# Patient Record
Sex: Female | Born: 1990 | Race: White | Hispanic: No | Marital: Married | Smoking: Never smoker
Health system: Southern US, Community
[De-identification: ages and names within clinical notes are randomized; demographics above are authoritative.]

## PROBLEM LIST (undated history)

## (undated) ENCOUNTER — Inpatient Hospital Stay (HOSPITAL_COMMUNITY): Payer: Self-pay

## (undated) DIAGNOSIS — O0993 Supervision of high risk pregnancy, unspecified, third trimester: Principal | ICD-10-CM

## (undated) DIAGNOSIS — J45909 Unspecified asthma, uncomplicated: Secondary | ICD-10-CM

## (undated) DIAGNOSIS — O24419 Gestational diabetes mellitus in pregnancy, unspecified control: Secondary | ICD-10-CM

## (undated) HISTORY — PX: APPENDECTOMY: SHX54

## (undated) HISTORY — DX: Supervision of high risk pregnancy, unspecified, third trimester: O09.93

## (undated) HISTORY — PX: DENTAL SURGERY: SHX609

---

## 2017-05-14 LAB — OB RESULTS CONSOLE RPR: RPR: NONREACTIVE

## 2017-05-14 LAB — OB RESULTS CONSOLE ABO/RH: RH Type: POSITIVE

## 2017-05-14 LAB — OB RESULTS CONSOLE GC/CHLAMYDIA
CHLAMYDIA, DNA PROBE: NEGATIVE
GC PROBE AMP, GENITAL: NEGATIVE

## 2017-05-14 LAB — OB RESULTS CONSOLE RUBELLA ANTIBODY, IGM: RUBELLA: IMMUNE

## 2017-05-14 LAB — OB RESULTS CONSOLE HIV ANTIBODY (ROUTINE TESTING): HIV: NONREACTIVE

## 2017-05-14 LAB — OB RESULTS CONSOLE HEPATITIS B SURFACE ANTIGEN: Hepatitis B Surface Ag: NEGATIVE

## 2017-05-14 LAB — CYTOLOGY - PAP: Pap: NEGATIVE

## 2017-06-30 NOTE — L&D Delivery Note (Signed)
Delivery Note At 12:57 PM a viable female was delivered via Vaginal, Spontaneous (Presentation: LOT, restitution to LOA).  APGAR: 9, 9; weight 3590g. Placenta status:intact, sent to L&D.  Cord: three vessels  with the following complications0: none .  Cord pH: N/A  Anesthesia:  Epidural Episiotomy: None Lacerations: 1st degree, hemostatic Suture Repair: N/A Est. Blood Loss (mL): 150  [facility] to [facility].  Baby to Couplet care / Skin to Skin.  Calvert CantorSamantha C Moani Weipert, CNM 12/11/2017, 1:14 PM

## 2017-08-06 ENCOUNTER — Inpatient Hospital Stay (HOSPITAL_COMMUNITY)
Admission: AD | Admit: 2017-08-06 | Discharge: 2017-08-07 | Disposition: A | Discharge status: Home / Self Care | Payer: Medicaid Other | Source: Ambulatory Visit | Attending: Obstetrics & Gynecology | Admitting: Obstetrics & Gynecology

## 2017-08-06 ENCOUNTER — Encounter (HOSPITAL_COMMUNITY): Payer: Self-pay | Admitting: *Deleted

## 2017-08-06 DIAGNOSIS — O4692 Antepartum hemorrhage, unspecified, second trimester: Secondary | ICD-10-CM | POA: Insufficient documentation

## 2017-08-06 DIAGNOSIS — Z3A21 21 weeks gestation of pregnancy: Secondary | ICD-10-CM | POA: Insufficient documentation

## 2017-08-06 DIAGNOSIS — Z833 Family history of diabetes mellitus: Secondary | ICD-10-CM | POA: Insufficient documentation

## 2017-08-06 DIAGNOSIS — O4702 False labor before 37 completed weeks of gestation, second trimester: Secondary | ICD-10-CM | POA: Insufficient documentation

## 2017-08-06 DIAGNOSIS — O479 False labor, unspecified: Secondary | ICD-10-CM

## 2017-08-06 DIAGNOSIS — O4690 Antepartum hemorrhage, unspecified, unspecified trimester: Secondary | ICD-10-CM

## 2017-08-06 DIAGNOSIS — N939 Abnormal uterine and vaginal bleeding, unspecified: Secondary | ICD-10-CM | POA: Diagnosis present

## 2017-08-06 HISTORY — DX: Unspecified asthma, uncomplicated: J45.909

## 2017-08-06 LAB — URINALYSIS, ROUTINE W REFLEX MICROSCOPIC
Bilirubin Urine: NEGATIVE
Glucose, UA: NEGATIVE mg/dL
Hgb urine dipstick: NEGATIVE
Ketones, ur: NEGATIVE mg/dL
NITRITE: NEGATIVE
PROTEIN: NEGATIVE mg/dL
SPECIFIC GRAVITY, URINE: 1.028 (ref 1.005–1.030)
pH: 5 (ref 5.0–8.0)

## 2017-08-06 NOTE — Discharge Instructions (Signed)
Safe Medications in Pregnancy   Acne: Benzoyl Peroxide Salicylic Acid  Backache/Headache: Tylenol: 2 regular strength every 4 hours OR              2 Extra strength every 6 hours  Colds/Coughs/Allergies: Benadryl (alcohol free) 25 mg every 6 hours as needed Breath right strips Claritin Cepacol throat lozenges Chloraseptic throat spray Cold-Eeze- up to three times per day Cough drops, alcohol free Flonase (by prescription only) Guaifenesin Mucinex Robitussin DM (plain only, alcohol free) Saline nasal spray/drops Sudafed (pseudoephedrine) & Actifed ** use only after [redacted] weeks gestation and if you do not have high blood pressure Tylenol Vicks Vaporub Zinc lozenges Zyrtec   Constipation: Colace Ducolax suppositories Fleet enema Glycerin suppositories Metamucil Milk of magnesia Miralax Senokot Smooth move tea  Diarrhea: Kaopectate Imodium A-D  *NO pepto Bismol  Hemorrhoids: Anusol Anusol HC Preparation H Tucks  Indigestion: Tums Maalox Mylanta Zantac  Pepcid  Insomnia: Benadryl (alcohol free) 25mg  every 6 hours as needed Tylenol PM Unisom, no Gelcaps  Leg Cramps: Tums MagGel  Nausea/Vomiting:  Bonine Dramamine Emetrol Ginger extract Sea bands Meclizine  Nausea medication to take during pregnancy:  Unisom (doxylamine succinate 25 mg tablets) Take one tablet daily at bedtime. If symptoms are not adequately controlled, the dose can be increased to a maximum recommended dose of two tablets daily (1/2 tablet in the morning, 1/2 tablet mid-afternoon and one at bedtime). Vitamin B6 100mg  tablets. Take one tablet twice a day (up to 200 mg per day).  Skin Rashes: Aveeno products Benadryl cream or 25mg  every 6 hours as needed Calamine Lotion 1% cortisone cream  Yeast infection: Gyne-lotrimin 7 Monistat 7   **If taking multiple medications, please check labels to avoid duplicating the same active ingredients **take medication as directed on  the label ** Do not exceed 4000 mg of tylenol in 24 hours **Do not take medications that contain aspirin or ibuprofen    [place] [facility] Providers    [facility]       Phone: [phone]  [facility] [place] Phone: [phone]  [facility] [place]  Phone: [phone]  [facility] [place]  Phone: [phone]  [facility] [place]  Phone: [phone]  [facility]       Phone: [phone]  [facility]    Phone: [phone]   [facility] [place]    Phone: [phone]  [facility]    Phone: [phone]  [facility]    Phone: [phone]  [facility]    Phone: [phone]  [facility]       Phone: [phone]   [facility]  Phone: [phone]  [facility]    Phone: [phone]  [facility] [place]   Phone: [phone]  [facility]      Phone: [phone]  [facility]    Phone: [phone]   AREA PEDIATRIC/FAMILY PRACTICE PHYSICIANS  [facility] [address] [place], [place]  [address] Phone - [phone]   Fax - [phone]  [facility] [address] [place], [place] [address] Phone - [phone]   Fax - [phone]  JACK AMOS [address] [place], [place]  [address] Phone - [phone]   Fax - [phone]  [facility] [address] [place], [place]  [address] Phone - [phone]   Fax - [phone]  [facility] [address] [place], [place]  [address] Phone - [phone]   Fax - [phone]  [facility] [address] [place], [place]  [address] Phone - [phone]   Fax - [phone]  [facility] [place] [address] [place], [place]  [address] Phone - [phone]   Fax - [phone]  [facility] [place] [address] [place], [address] [place], [place]  [address] Phone - [phone]   Fax - [phone]  [facility] [address] [place], [place]  [address] Phone - [phone]   Fax - [phone] [facility] [place] [address] [place], [place]  [address] Phone - [phone]   Fax - [phone]  [facility] [address] [place], [place]  [address] Phone - [phone]   Fax - [phone]  [facility] [address] [place], [place]  [address] Phone - [phone]   Fax - [phone]  [facility] [place] [address] [place], [place]  [address] Phone - [phone]   Fax - [phone]  [facility] [address], [address] [place], [place]  [address] Phone - [phone]  [facility] [address] [place], [place]  [address] Phone - [phone]   Fax - [phone]  [facility] [address] [place], [place]  [address] Phone - [phone]   Fax - [phone]  [facility] [address] [place], [place]  [address] Phone - [phone]   Fax - [phone]  [facility] [address] [place], [place]  [address] Phone - [phone]   Fax - [phone]   [facility] [address] [place], [place]  [address] Phone - [phone]   Fax - [phone]  [facility] [address] [place], [place]  [address] Phone - [phone]   Fax - [phone]  DAVID RUBIN [address] [place], [place]  [address] Phone - [phone]   Fax -  [phone]  [facility] [address] [place], [place]  [address] Phone - [phone]   Fax - [phone]  [facility] Alita Chyle [address] [place], [place]  [address] Phone - [phone]   Fax - [phone] Gerarda Fraction [address] [place], [place]  [address] Phone - [phone]   Fax - [phone]  [facility] [address] [place], [place]  [address] Phone - [phone]   Fax - [phone]  [facility] - [place] [address] [place], [place]  [address] Phone - [phone]   Fax - [phone]  [facility]  Wyvonne Lenz MD [address] [place] [address] Phone [phone]  Fax [phone]

## 2017-08-06 NOTE — MAU Note (Addendum)
Pt presents to MAU c/o abdominal ctxs (x3) that started at 1649, 1705, and 1930. Pt states she has had braxton hicks recently but these were stronger. Pt had vaginal bleeding yesterday when she wiped and it was a bright red bleeding. The patient stated she wore a pad after the blood she noticed when she wiped and she never saw any more bleeding.Pt states early on in the pregnancy she had bleeding behind her placenta at 7weeks.  LMP: SEPT 14th 2018

## 2017-08-07 ENCOUNTER — Inpatient Hospital Stay (HOSPITAL_COMMUNITY): Payer: Medicaid Other

## 2017-08-07 DIAGNOSIS — O4690 Antepartum hemorrhage, unspecified, unspecified trimester: Secondary | ICD-10-CM

## 2017-08-07 LAB — WET PREP, GENITAL
SPERM: NONE SEEN
Trich, Wet Prep: NONE SEEN
YEAST WET PREP: NONE SEEN

## 2017-08-07 NOTE — MAU Provider Note (Signed)
History     CSN: [id number]  Arrival date and time: 08/06/17 2312   First Provider Initiated Contact with Patient 08/06/17 2356      Chief Complaint  Patient presents with  . Vaginal Bleeding  . Contractions   HPI Cathy Park is a 27 y.o. G3P2002 at [redacted]w[redacted]d who presents with abdominal pain and vaginal bleeding. She states yesterday she saw bright red bleeding on the tissue when she wipes. She has seen no bleeding since. She also reports feeling 3 contractions throughout the day today. She denies any leaking of fluid. Reports good fetal movement.   OB History    Gravida Para Term Preterm AB Living   3 2 2     2    SAB TAB Ectopic Multiple Live Births           2      Past Medical History:  Diagnosis Date  . Asthma     Past Surgical History:  Procedure Laterality Date  . APPENDECTOMY    . DENTAL SURGERY     drained infection    Family History  Problem Relation Age of Onset  . Diabetes Mother   . Diabetes Brother   . Cancer Paternal Aunt   . Diabetes Maternal Grandmother   . Diabetes Maternal Grandfather   . Cancer Paternal Grandmother   . Cancer Paternal Grandfather     Social History   Tobacco Use  . Smoking status: Never Smoker  . Smokeless tobacco: Never Used  Substance Use Topics  . Alcohol use: No    Frequency: Never  . Drug use: No    Allergies: No Known Allergies  No medications prior to admission.    Review of Systems  Constitutional: Negative.  Negative for fatigue and fever.  HENT: Negative.   Respiratory: Negative.  Negative for shortness of breath.   Cardiovascular: Negative.  Negative for chest pain.  Gastrointestinal: Positive for abdominal pain. Negative for constipation, diarrhea, nausea and vomiting.  Genitourinary: Positive for vaginal bleeding. Negative for dysuria.  Neurological: Negative.  Negative for dizziness and headaches.   Physical Exam   Blood pressure (!) 100/54, pulse 100, temperature 98.8 F (37.1 C), temperature  source Oral, resp. rate 19, height 5\' 2"  (1.575 m), weight 229 lb (103.9 kg), last menstrual period 03/13/2017.  Physical Exam  Nursing note and vitals reviewed. Constitutional: She is oriented to person, place, and time. She appears well-developed and well-nourished. No distress.  HENT:  Head: Normocephalic.  Eyes: Pupils are equal, round, and reactive to light.  Cardiovascular: Normal rate, regular rhythm and normal heart sounds.  Respiratory: Effort normal and breath sounds normal. No respiratory distress.  GI: Soft. Bowel sounds are normal. She exhibits no distension. There is no tenderness.  Neurological: She is alert and oriented to person, place, and time.  Skin: Skin is warm and dry.  Psychiatric: She has a normal mood and affect. Her behavior is normal. Judgment and thought content normal.   Pelvic exam: Cervix pink, visually closed, without lesion, scant white creamy discharge, vaginal walls and external genitalia normal  FHT: 153 bpm  Dilation: Closed Effacement (%): Thick Cervical Position: Posterior Exam by:: Antony Odea CNM    MAU Course  Procedures Results for orders placed or performed during the hospital encounter of 08/06/17 (from the past 24 hour(s))  Urinalysis, Routine w reflex microscopic     Status: Abnormal   Collection Time: 08/06/17 11:35 PM  Result Value Ref Range   Color, Urine YELLOW YELLOW  APPearance CLOUDY (A) CLEAR   Specific Gravity, Urine 1.028 1.005 - 1.030   pH 5.0 5.0 - 8.0   Glucose, UA NEGATIVE NEGATIVE mg/dL   Hgb urine dipstick NEGATIVE NEGATIVE   Bilirubin Urine NEGATIVE NEGATIVE   Ketones, ur NEGATIVE NEGATIVE mg/dL   Protein, ur NEGATIVE NEGATIVE mg/dL   Nitrite NEGATIVE NEGATIVE   Leukocytes, UA TRACE (A) NEGATIVE   RBC / HPF 0-5 0 - 5 RBC/hpf   WBC, UA 0-5 0 - 5 WBC/hpf   Bacteria, UA RARE (A) NONE SEEN   Squamous Epithelial / LPF 6-30 (A) NONE SEEN   Mucus PRESENT   Wet prep, genital     Status: Abnormal   Collection  Time: 08/07/17 12:00 AM  Result Value Ref Range   Yeast Wet Prep HPF POC NONE SEEN NONE SEEN   Trich, Wet Prep NONE SEEN NONE SEEN   Clue Cells Wet Prep HPF POC PRESENT (A) NONE SEEN   WBC, Wet Prep HPF POC FEW (A) NONE SEEN   Sperm NONE SEEN    MDM UA US MFM OB Limited- placenta posterior above the os, normal AFI No vaginal bleeding on exam  Wet prep and gc/chlamydia  Assessment and Plan   1. Vaginal bleeding in pregnancy   2. Braxton Hick's contraction   3. [redacted] weeks gestation of pregnancy    -Discharge home in stable condition -Vaginal bleeding precautions discussed -Patient advised to follow-up with OB/GYN provider of choice ASAP to resume prenatal care -Patient may return to MAU as needed or if her condition were to change or worsen  Rolm BookbinderCaroline M Averie Hornbaker CNM 08/07/2017, 1:00 AM

## 2017-08-10 LAB — GC/CHLAMYDIA PROBE AMP (~~LOC~~) NOT AT ARMC
Chlamydia: NEGATIVE
Neisseria Gonorrhea: NEGATIVE

## 2017-08-14 ENCOUNTER — Encounter (HOSPITAL_COMMUNITY): Payer: Self-pay | Admitting: Emergency Medicine

## 2017-08-14 ENCOUNTER — Other Ambulatory Visit: Payer: Self-pay

## 2017-08-14 DIAGNOSIS — R197 Diarrhea, unspecified: Secondary | ICD-10-CM | POA: Diagnosis not present

## 2017-08-14 DIAGNOSIS — R6883 Chills (without fever): Secondary | ICD-10-CM | POA: Diagnosis not present

## 2017-08-14 DIAGNOSIS — Z79899 Other long term (current) drug therapy: Secondary | ICD-10-CM | POA: Diagnosis not present

## 2017-08-14 DIAGNOSIS — J45909 Unspecified asthma, uncomplicated: Secondary | ICD-10-CM | POA: Diagnosis not present

## 2017-08-14 DIAGNOSIS — Z3A22 22 weeks gestation of pregnancy: Secondary | ICD-10-CM | POA: Insufficient documentation

## 2017-08-14 DIAGNOSIS — O219 Vomiting of pregnancy, unspecified: Secondary | ICD-10-CM | POA: Diagnosis present

## 2017-08-14 LAB — CBC
HEMATOCRIT: 37.3 % (ref 36.0–46.0)
Hemoglobin: 11.7 g/dL — ABNORMAL LOW (ref 12.0–15.0)
MCH: 24.2 pg — AB (ref 26.0–34.0)
MCHC: 31.4 g/dL (ref 30.0–36.0)
MCV: 77.2 fL — ABNORMAL LOW (ref 78.0–100.0)
Platelets: 226 10*3/uL (ref 150–400)
RBC: 4.83 MIL/uL (ref 3.87–5.11)
RDW: 15.6 % — ABNORMAL HIGH (ref 11.5–15.5)
WBC: 10.4 10*3/uL (ref 4.0–10.5)

## 2017-08-14 LAB — BASIC METABOLIC PANEL
Anion gap: 13 (ref 5–15)
BUN: 8 mg/dL (ref 6–20)
CHLORIDE: 102 mmol/L (ref 101–111)
CO2: 22 mmol/L (ref 22–32)
Calcium: 8.6 mg/dL — ABNORMAL LOW (ref 8.9–10.3)
Creatinine, Ser: 0.6 mg/dL (ref 0.44–1.00)
GFR calc non Af Amer: >60 mL/min (ref >60)
Glucose, Bld: 116 mg/dL — ABNORMAL HIGH (ref 65–99)
Potassium: 3.5 mmol/L (ref 3.5–5.1)
SODIUM: 137 mmol/L (ref 135–145)

## 2017-08-14 NOTE — ED Triage Notes (Signed)
Pt C/O vomiting that started this morning around 0900. Pt states she feels like she is dehydrated and cannot keep anything down.

## 2017-08-15 ENCOUNTER — Emergency Department (HOSPITAL_COMMUNITY)
Admission: EM | Admit: 2017-08-15 | Discharge: 2017-08-15 | Disposition: A | Discharge status: Home / Self Care | Payer: Medicaid Other | Attending: Emergency Medicine | Admitting: Emergency Medicine

## 2017-08-15 DIAGNOSIS — R197 Diarrhea, unspecified: Secondary | ICD-10-CM

## 2017-08-15 DIAGNOSIS — R1110 Vomiting, unspecified: Secondary | ICD-10-CM

## 2017-08-15 MED ORDER — SODIUM CHLORIDE 0.9 % IV BOLUS (SEPSIS)
1000.0000 mL | Freq: Once | INTRAVENOUS | Status: AC
  Administered 2017-08-15: 1000 mL via INTRAVENOUS

## 2017-08-15 MED ORDER — ONDANSETRON 4 MG PO TBDP: 4.0000 mg | ORAL_TABLET | Freq: Three times a day (TID) | ORAL | 0 refills | Status: DC | PRN

## 2017-08-15 MED ORDER — ONDANSETRON HCL 4 MG/2ML IJ SOLN
4.0000 mg | Freq: Once | INTRAMUSCULAR | Status: AC
  Administered 2017-08-15: 4 mg via INTRAVENOUS
  Filled 2017-08-15: qty 2

## 2017-08-15 NOTE — ED Notes (Signed)
Pt alert & oriented x4, stable gait. Patient given discharge instructions, paperwork & prescription(s). Patient  instructed to stop at the registration desk to finish any additional paperwork. Patient verbalized understanding. Pt left department w/ no further questions. 

## 2017-08-15 NOTE — ED Notes (Signed)
Pt states vomiting that started yesterday. Last vomited around 2200.

## 2017-08-15 NOTE — ED Provider Notes (Signed)
[facility]ENCY [facility]PART[facility] Provider Note   CSN: [id number] Arrival date & time: 08/14/17  2205     History   Chief Complaint Chief Complaint  Patient presents with  . Emesis During Pregnancy    HPI Cathy RiggsLauren Park is a 27 y.o. female.  The history is provided by the patient.  Emesis   This is a new problem. The current episode started yesterday. The problem has been gradually worsening. The emesis has an appearance of stomach contents. There has been no fever. Associated symptoms include chills and diarrhea. Pertinent negatives include no abdominal pain, no cough, no fever and no URI. Risk factors include ill contacts.   She Is approximately [redacted] weeks pregnant.  She reports that her family members have been sick with vomiting and diarrhea.  She reports starting yesterday morning she been having vomiting and diarrhea.  It has been nonbloody.  There is no fever.  There is no cough/sore throat. No abdominal pain.  No falls.  No vaginal bleeding.  No vaginal discharge.  No gush of fluid.  She reports appropriate fetal movement Reports feeling lightheaded upon standing, and reports feeling dehydrated Past Medical History:  Diagnosis Date  . Asthma     There are no active problems to display for this patient.   Past Surgical History:  Procedure Laterality Date  . APPENDECTOMY    . DENTAL SURGERY     drained infection    OB History    Gravida Para Term Preterm AB Living   3 2 2     2    SAB TAB Ectopic Multiple Live Births           2       Home Medications    Prior to Admission medications   Medication Sig Start Date End Date Taking? Authorizing Provider  acetaminophen (TYLENOL) 325 MG tablet Take 650 mg by mouth every 6 (six) hours as needed.    [provider]  ondansetron (ZOFRAN-ODT) 8 MG disintegrating tablet Take 8 mg by mouth every 8 (eight) hours as needed for nausea or vomiting.    [provider]  Prenatal Vit-Fe Fumarate-FA (PRENATAL  MULTIVITAMIN) TABS tablet Take 1 tablet by mouth daily at 12 noon.    [provider]    Family History Family History  Problem Relation Age of Onset  . Diabetes Mother   . Diabetes Brother   . Cancer Paternal Aunt   . Diabetes Maternal Grandmother   . Diabetes Maternal Grandfather   . Cancer Paternal Grandmother   . Cancer Paternal Grandfather     Social History Social History   Tobacco Use  . Smoking status: Never Smoker  . Smokeless tobacco: Never Used  Substance Use Topics  . Alcohol use: No    Frequency: Never  . Drug use: No     Allergies   Patient has no known allergies.   Review of Systems Review of Systems  Constitutional: Positive for chills. Negative for fever.  Respiratory: Negative for cough.   Gastrointestinal: Positive for diarrhea and vomiting. Negative for abdominal pain and blood in stool.  Genitourinary: Negative for vaginal bleeding and vaginal discharge.  All other systems reviewed and are negative.    Physical Exam Updated Vital Signs BP 115/64 (BP Location: Left Arm)   Pulse (!) 102   Temp 98.2 F (36.8 C) (Oral)   Resp 20   Ht 1.575 m (5\' 2" )   Wt 102.1 kg (225 lb)   LMP 03/13/2017 (Exact Date)  SpO2 98%   BMI 41.15 kg/m   Physical Exam  CONSTITUTIONAL: Well developed/well nourished HEAD: Normocephalic/atraumatic EYES: EOMI/PERRL ENMT: Mucous membranes dry NECK: supple no meningeal signs SPINE/BACK:entire spine nontender CV: S1/S2 noted, no murmurs/rubs/gallops noted LUNGS: Lungs are clear to auscultation bilaterally, no apparent distress ABDOMEN: soft, nontender, no rebound or guarding, bowel sounds noted throughout abdomen, gravid GU:no cva tenderness NEURO: Pt is awake/alert/appropriate, moves all extremitiesx4.  No facial droop.   EXTREMITIES: pulses normal/equal, full ROM SKIN: warm, color normal PSYCH: no abnormalities of mood noted, alert and oriented to situation  ED Treatments / Results  Labs (all  labs ordered are listed, but only abnormal results are displayed) Labs Reviewed  CBC - Abnormal; Notable for the following components:      Result Value   Hemoglobin 11.7 (*)    MCV 77.2 (*)    MCH 24.2 (*)    RDW 15.6 (*)    All other components within normal limits  BASIC METABOLIC PANEL - Abnormal; Notable for the following components:   Glucose, Bld 116 (*)    Calcium 8.6 (*)    All other components within normal limits    EKG  EKG Interpretation None       Radiology No results found.  Procedures Procedures (including critical care time)  Medications Ordered in ED Medications  sodium chloride 0.9 % bolus 1,000 mL (1,000 mLs Intravenous New Bag/Given 08/15/17 0238)  sodium chloride 0.9 % bolus 1,000 mL (0 mLs Intravenous Stopped 08/15/17 0344)  ondansetron (ZOFRAN) injection 4 mg (4 mg Intravenous Given 08/15/17 0237)     Initial Impression / Assessment and Plan / ED Course  I have reviewed the triage vital signs and the nursing notes.  Pertinent labs results that were available during my care of the patient were reviewed by me and considered in my medical decision making (see chart for details).     3:28 AM Patient is approximately [redacted] weeks pregnant, and had a recent fetal ultrasound confirming IUP.  She has good follow-up with OB/GYN planned.  She denies abdominal pain/cramping/vaginal bleeding or discharge.  Fetal heart tones appropriate she reports fetal movement.  No further workup required for obstetrics.  Will give IV fluids and reassess. 4:20 AM Resting comfortably, reports feeling improved.  She is in no distress.  She denies any pain.  Will give short course of Zofran at this stage of pregnancy.  We discussed strict return precautions and need to increase her fluids Final Clinical Impressions(s) / ED Diagnoses   Final diagnoses:  Vomiting and diarrhea    ED Discharge Orders        Ordered    ondansetron (ZOFRAN-ODT) 4 MG disintegrating tablet  Every 8  hours PRN     08/15/17 0419       Zadie Rhine, MD 08/15/17 [phone]

## 2017-08-17 ENCOUNTER — Encounter: Payer: Medicaid Other | Admitting: Family Medicine

## 2017-08-20 ENCOUNTER — Encounter: Payer: Self-pay | Admitting: Radiology

## 2017-08-25 ENCOUNTER — Encounter: Payer: Medicaid Other | Admitting: Obstetrics & Gynecology

## 2017-08-26 ENCOUNTER — Encounter: Payer: Self-pay | Admitting: Student

## 2017-08-26 ENCOUNTER — Other Ambulatory Visit (HOSPITAL_COMMUNITY)
Admission: RE | Admit: 2017-08-26 | Discharge: 2017-08-26 | Disposition: A | Discharge status: Home / Self Care | Payer: Medicaid Other | Source: Ambulatory Visit | Attending: Family Medicine | Admitting: Family Medicine

## 2017-08-26 ENCOUNTER — Ambulatory Visit (INDEPENDENT_AMBULATORY_CARE_PROVIDER_SITE_OTHER): Payer: Medicaid Other | Admitting: Student

## 2017-08-26 VITALS — BP 124/78 | HR 97 | Wt 227.0 lb

## 2017-08-26 DIAGNOSIS — Z23 Encounter for immunization: Secondary | ICD-10-CM

## 2017-08-26 DIAGNOSIS — O0993 Supervision of high risk pregnancy, unspecified, third trimester: Secondary | ICD-10-CM

## 2017-08-26 DIAGNOSIS — Z3480 Encounter for supervision of other normal pregnancy, unspecified trimester: Secondary | ICD-10-CM | POA: Diagnosis not present

## 2017-08-26 DIAGNOSIS — O26849 Uterine size-date discrepancy, unspecified trimester: Secondary | ICD-10-CM | POA: Insufficient documentation

## 2017-08-26 DIAGNOSIS — Z3482 Encounter for supervision of other normal pregnancy, second trimester: Secondary | ICD-10-CM | POA: Diagnosis not present

## 2017-08-26 DIAGNOSIS — O26842 Uterine size-date discrepancy, second trimester: Secondary | ICD-10-CM

## 2017-08-26 HISTORY — DX: Supervision of high risk pregnancy, unspecified, third trimester: O09.93

## 2017-08-26 MED ORDER — ONDANSETRON 8 MG PO TBDP: 8.0000 mg | ORAL_TABLET | Freq: Three times a day (TID) | ORAL | 0 refills | Status: DC | PRN

## 2017-08-26 NOTE — Progress Notes (Signed)
Pain at pubic bone area. Needs anatomy scan scheduled.

## 2017-08-26 NOTE — Patient Instructions (Signed)

## 2017-08-26 NOTE — Progress Notes (Signed)
  Subjective:    Cathy RiggsLauren Park is being seen today for her first obstetrical visit.  This is not a planned pregnancy. She is at 6641w5d gestation. Her obstetrical history is significant for nothing.. Relationship with FOB: spouse, living together. Patient does intend to breast feed. Pregnancy history fully reviewed.  Patient reports occasional pubic bone pain. .  Review of Systems:   Review of Systems  Constitutional: Negative.   HENT: Negative.   Respiratory: Negative.   Cardiovascular: Negative.   Gastrointestinal: Negative.   Genitourinary: Negative.   Musculoskeletal: Negative.   Neurological: Negative.   Hematological: Negative.     Objective:     BP 124/78   Pulse 97   Wt 227 lb (103 kg)   LMP 03/13/2017 (Exact Date)   BMI 41.52 kg/m  Physical Exam  Constitutional: She is oriented to person, place, and time. She appears well-developed and well-nourished.  HENT:  Head: Normocephalic.  Neck: Normal range of motion.  Respiratory: Effort normal.  GI: Soft. Bowel sounds are normal.  Musculoskeletal: Normal range of motion.  Neurological: She is alert and oriented to person, place, and time.  Skin: Skin is warm and dry.  Psychiatric: She has a normal mood and affect.    Exam    Assessment:    Pregnancy: [id number] Patient Active Problem List   Diagnosis Date Noted  . Supervision of other normal pregnancy, antepartum 08/26/2017       Plan:     Initial labs drawn. Prenatal vitamins. Problem list reviewed and updated. AFP3 discussed: ordered NIPS. Role of ultrasound in pregnancy discussed; fetal survey: ordered. Amniocentesis discussed: not indicated. Follow up in 4 weeks. 50% of 30 min visit spent on counseling and coordination of care.   -Anatomy scan; HgbA1c.  -Oriented patient to Faculty [facility] -RX for Zofran -[facility] for [facility] to schedule next appt with 2 hour GTT and 28 weeks lab ([place] in [name]).  -All questions  answered  Cathy GaribaldiKathryn Park [facility] CNM 08/26/2017

## 2017-08-27 LAB — ANTIBODY SCREEN: ANTIBODY SCREEN: NEGATIVE

## 2017-08-27 LAB — HEMOGLOBIN A1C
Est. average glucose Bld gHb Est-mCnc: 103 mg/dL
Hgb A1c MFr Bld: 5.2 % (ref 4.8–5.6)

## 2017-08-27 LAB — GC/CHLAMYDIA PROBE AMP (~~LOC~~) NOT AT ARMC
CHLAMYDIA, DNA PROBE: NEGATIVE
Neisseria Gonorrhea: NEGATIVE

## 2017-08-28 LAB — URINE CULTURE, OB REFLEX

## 2017-08-28 LAB — CULTURE, OB URINE

## 2017-08-31 ENCOUNTER — Encounter: Payer: Medicaid Other | Admitting: Advanced Practice Midwife

## 2017-08-31 ENCOUNTER — Other Ambulatory Visit (HOSPITAL_COMMUNITY): Payer: Medicaid Other

## 2017-09-01 ENCOUNTER — Other Ambulatory Visit: Payer: Self-pay | Admitting: Student

## 2017-09-01 ENCOUNTER — Ambulatory Visit (HOSPITAL_COMMUNITY)
Admission: RE | Admit: 2017-09-01 | Discharge: 2017-09-01 | Disposition: A | Discharge status: Home / Self Care | Payer: Medicaid Other | Source: Ambulatory Visit | Attending: Student | Admitting: Student

## 2017-09-01 DIAGNOSIS — Z3689 Encounter for other specified antenatal screening: Secondary | ICD-10-CM

## 2017-09-01 DIAGNOSIS — Z3A24 24 weeks gestation of pregnancy: Secondary | ICD-10-CM | POA: Diagnosis not present

## 2017-09-01 DIAGNOSIS — O99212 Obesity complicating pregnancy, second trimester: Secondary | ICD-10-CM | POA: Insufficient documentation

## 2017-09-01 DIAGNOSIS — Z3480 Encounter for supervision of other normal pregnancy, unspecified trimester: Secondary | ICD-10-CM

## 2017-09-02 LAB — SMN1 COPY NUMBER ANALYSIS (SMA CARRIER SCREENING)

## 2017-09-02 LAB — HEMOGLOBINOPATHY EVALUATION
HEMOGLOBIN A2 QUANTITATION: 2.1 % (ref 1.8–3.2)
HEMOGLOBIN F QUANTITATION: 0 % (ref 0.0–2.0)
HGB A: 97.9 % (ref 96.4–98.8)
HGB C: 0 %
HGB S: 0 %
HGB VARIANT: 0 %

## 2017-09-02 LAB — CYSTIC FIBROSIS MUTATION 97: Interpretation: NOT DETECTED

## 2017-09-04 ENCOUNTER — Telehealth: Payer: Self-pay | Admitting: *Deleted

## 2017-09-04 ENCOUNTER — Encounter: Payer: Self-pay | Admitting: *Deleted

## 2017-09-04 NOTE — Telephone Encounter (Signed)
Erroneous encounter

## 2017-09-23 ENCOUNTER — Ambulatory Visit (INDEPENDENT_AMBULATORY_CARE_PROVIDER_SITE_OTHER): Payer: Medicaid Other | Admitting: Nurse Practitioner

## 2017-09-23 ENCOUNTER — Encounter: Payer: Self-pay | Admitting: *Deleted

## 2017-09-23 ENCOUNTER — Other Ambulatory Visit: Payer: Self-pay

## 2017-09-23 VITALS — BP 102/71 | HR 71 | Wt 233.2 lb

## 2017-09-23 DIAGNOSIS — Z3480 Encounter for supervision of other normal pregnancy, unspecified trimester: Secondary | ICD-10-CM

## 2017-09-23 DIAGNOSIS — O99212 Obesity complicating pregnancy, second trimester: Secondary | ICD-10-CM

## 2017-09-23 MED ORDER — ONDANSETRON 8 MG PO TBDP: 8.0000 mg | ORAL_TABLET | Freq: Three times a day (TID) | ORAL | 1 refills | Status: DC | PRN

## 2017-09-23 NOTE — Patient Instructions (Signed)
Eating Plan for Hyperemesis Gravidarum °Hyperemesis gravidarum is a severe form of morning sickness. Because this condition causes severe nausea and vomiting, it can lead to dehydration, malnutrition, and weight loss. One way to lessen the symptoms of nausea and vomiting is to follow the eating plan for hyperemesis gravidarum. It is often used along with prescribed medicines to control your symptoms. °What can I do to relieve my symptoms? °Listen to your body. Everyone is different and has different preferences. Find what works best for you. Take any of the following actions that are helpful to you: °· Eat and drink slowly. °· Eat 5-6 small meals daily instead of 3 large meals. °· Eat crackers before you get out of bed in the morning. °· Try having a snack in the middle of the night. °· Starchy foods are usually tolerated well. Examples include cereal, toast, bread, potatoes, pasta, rice, and pretzels. °· Ginger may help with nausea. Add ¼ tsp ground ginger to hot tea or choose ginger tea. °· Try drinking 100% fruit juice or an electrolyte drink. An electrolyte drink contains sodium, potassium, and chloride. °· Continue to take your prenatal vitamins as told by your health care provider. If you are having trouble taking your prenatal vitamins, talk with your health care provider about different options. °· Include at least 1 serving of protein with your meals and snacks. Protein options include meats or poultry, beans, nuts, eggs, and yogurt. Try eating a protein-rich snack before bed. Examples of these snacks include cheese and crackers or half of a peanut butter or turkey sandwich. °· Consider eliminating foods that trigger your symptoms. These may include spicy foods, coffee, high-fat foods, very sweet foods, and acidic foods. °· Try meals that have more protein combined with bland, salty, lower-fat, and dry foods, such as nuts, seeds, pretzels, crackers, and cereal. °· Talk with your healthcare provider about  starting a supplement of vitamin B6. °· Have fluids that are cold, clear, and carbonated or sour. Examples include lemonade, ginger ale, lemon-lime soda, ice water, and sparkling water. °· Try lemon or mint tea. °· Try brushing your teeth or using a mouth rinse after meals. ° °What should I avoid to reduce my symptoms? °Avoiding some of the following things may help reduce your symptoms. °· Foods with strong smells. Try eating meals in well-ventilated areas that are free of odors. °· Drinking water or other beverages with meals. Try not to drink anything during the 30 minutes before and after your meals. °· Drinking more than 1 cup of fluid at a time. Sometimes using a straw helps. °· Fried or high-fat foods, such as butter and cream sauces. °· Spicy foods. °· Skipping meals as best as you can. Nausea can be more intense on an empty stomach. If you cannot tolerate food at that time, do not force it. Try sucking on ice chips or other frozen items, and make up for missed calories later. °· Lying down within 2 hours after eating. °· Environmental triggers. These may include smoky rooms, closed spaces, rooms with strong smells, warm or humid places, overly loud and noisy rooms, and rooms with motion or flickering lights. °· Quick and sudden changes in your movement. ° °This information is not intended to replace advice given to you by your health care provider. Make sure you discuss any questions you have with your health care provider. °Document Released: 04/13/2007 Document Revised: 02/13/2016 Document Reviewed: 01/15/2016 °Elsevier Interactive Patient Education © 2018 Elsevier Inc. ° °

## 2017-09-23 NOTE — Progress Notes (Signed)
    Subjective:  Cathy Park is a 27 y.o. G3P2002 at 515w5d being seen today for ongoing prenatal care.  She is currently monitored for the following issues for this low-risk pregnancy and has Supervision of other normal pregnancy, antepartum; Uterine size date discrepancy; Encounter for fetal anatomic survey; Obesity affecting pregnancy in second trimester; and [redacted] weeks gestation of pregnancy on their problem list.  Patient reports no complaints.  Contractions: Irregular.  .  Movement: Present. Denies leaking of fluid.   The following portions of the patient's history were reviewed and updated as appropriate: allergies, current medications, past family history, past medical history, past social history, past surgical history and problem list. Problem list updated.  Objective:   Vitals:   09/23/17 1326  BP: 102/71  Pulse: 71  Weight: 233 lb 3.2 oz (105.8 kg)    Fetal Status: Fetal Heart Rate (bpm): 154 Fundal Height: 30 cm Movement: Present     General:  Alert, oriented and cooperative. Patient is in no acute distress.  Skin: Skin is warm and dry. No rash noted.   Cardiovascular: Normal heart rate noted  Respiratory: Normal respiratory effort, no problems with respiration noted  Abdomen: Soft, gravid, appropriate for gestational age. Pain/Pressure: Absent     Pelvic:  Cervical exam deferred        Extremities: Normal range of motion.  Edema: Trace  Mental Status: Normal mood and affect. Normal behavior. Normal judgment and thought content.   Urinalysis:      Assessment and Plan:  Pregnancy: G3P2002 at 215w5d  1. Supervision of other normal pregnancy, antepartum Sign BTL papers today - done Was not fasting today and will be seen in one week for glucola and other labs.  2. Obesity affecting pregnancy in second trimester Advised support knee highs as client is beginning to have lower extremity edema  Preterm labor symptoms and general obstetric precautions including but not  limited to vaginal bleeding, contractions, leaking of fluid and fetal movement were reviewed in detail with the patient. Please refer to After Visit Summary for other counseling recommendations.  Return in about 1 week (around 09/30/2017).  Nolene BernheimERRI BURLESON, RN, MSN, NP-BC Nurse Practitioner, [facility], [facility] 09/23/2017 5:04 PM

## 2017-09-28 ENCOUNTER — Other Ambulatory Visit (INDEPENDENT_AMBULATORY_CARE_PROVIDER_SITE_OTHER): Payer: Medicaid Other

## 2017-09-28 DIAGNOSIS — Z23 Encounter for immunization: Secondary | ICD-10-CM | POA: Diagnosis not present

## 2017-09-28 DIAGNOSIS — Z3402 Encounter for supervision of normal first pregnancy, second trimester: Secondary | ICD-10-CM

## 2017-09-28 DIAGNOSIS — O24419 Gestational diabetes mellitus in pregnancy, unspecified control: Secondary | ICD-10-CM

## 2017-09-28 NOTE — Progress Notes (Signed)
Patient presented to the office today for 28 weeks labs and tdap. Patient received tdap in left deltoid muscle.

## 2017-09-29 LAB — HIV ANTIBODY (ROUTINE TESTING W REFLEX): HIV SCREEN 4TH GENERATION: NONREACTIVE

## 2017-09-29 LAB — RPR: RPR Ser Ql: NONREACTIVE

## 2017-09-29 LAB — GLUCOSE TOLERANCE, 2 HOURS W/ 1HR
Glucose, 1 hour: 183 mg/dL — ABNORMAL HIGH (ref 65–179)
Glucose, 2 hour: 118 mg/dL (ref 65–152)
Glucose, Fasting: 99 mg/dL — ABNORMAL HIGH (ref 65–91)

## 2017-10-01 ENCOUNTER — Other Ambulatory Visit: Payer: Self-pay

## 2017-10-01 ENCOUNTER — Telehealth: Payer: Self-pay

## 2017-10-01 ENCOUNTER — Encounter: Payer: Self-pay | Admitting: Family Medicine

## 2017-10-01 DIAGNOSIS — O24419 Gestational diabetes mellitus in pregnancy, unspecified control: Secondary | ICD-10-CM

## 2017-10-01 MED ORDER — GLUCOSE BLOOD VI STRP: ORAL_STRIP | 12 refills | Status: DC

## 2017-10-01 MED ORDER — ACCU-CHEK GUIDE W/DEVICE KIT: 1.0000 | PACK | 0 refills | Status: DC | PRN

## 2017-10-01 MED ORDER — ACCU-CHEK FASTCLIX LANCETS MISC
1.0000 | Freq: Four times a day (QID) | 12 refills | Status: DC
Start: 2017-10-01 — End: 2017-12-13

## 2017-10-01 NOTE — Telephone Encounter (Signed)
Call patient and left a message we are calling with some test results and to please call our office back.Order has been placed for patient to see Diabetes and Nutrition

## 2017-10-01 NOTE — Telephone Encounter (Signed)
-----   Message from Reva Boresanya S Pratt, MD sent at 10/01/2017  8:24 AM EDT ----- Has GDM--needs diabetes and nutrition management

## 2017-10-02 ENCOUNTER — Ambulatory Visit (HOSPITAL_COMMUNITY): Payer: Medicaid Other

## 2017-10-14 ENCOUNTER — Ambulatory Visit (INDEPENDENT_AMBULATORY_CARE_PROVIDER_SITE_OTHER): Payer: Medicaid Other | Admitting: Family Medicine

## 2017-10-14 VITALS — BP 107/71 | HR 72 | Wt 238.4 lb

## 2017-10-14 DIAGNOSIS — O26843 Uterine size-date discrepancy, third trimester: Secondary | ICD-10-CM

## 2017-10-14 DIAGNOSIS — O24410 Gestational diabetes mellitus in pregnancy, diet controlled: Secondary | ICD-10-CM

## 2017-10-14 DIAGNOSIS — Z3480 Encounter for supervision of other normal pregnancy, unspecified trimester: Secondary | ICD-10-CM

## 2017-10-14 DIAGNOSIS — Z3483 Encounter for supervision of other normal pregnancy, third trimester: Secondary | ICD-10-CM

## 2017-10-14 DIAGNOSIS — O99212 Obesity complicating pregnancy, second trimester: Secondary | ICD-10-CM

## 2017-10-14 LAB — POCT URINE QUALITATIVE DIPSTICK BLOOD: RBC UA: NEGATIVE

## 2017-10-14 NOTE — Patient Instructions (Addendum)
Checking your sugars  First thing in the morning-- Fasting blood sugar (<95)  Bfast  1hr after eating big meals (b,l,d) (<140), 2hr after eating (b,l,d) (<120)   Gestational Diabetes Mellitus, Self Care Caring for yourself after you have been diagnosed with gestational diabetes (gestational diabetes mellitus) means keeping your blood sugar (glucose) under control with a balance of:  Nutrition.  Exercise.  Lifestyle changes.  Medicines or insulin, if necessary.  Support from your team of health care providers and others.  The following information explains what you need to know to manage your gestational diabetes at home. What do I need to do to manage my blood glucose?  Check your blood glucose every day during your pregnancy. Do this as often as told by your health care provider.  Contact your health care provider if your blood glucose is above your target for 2 tests in a row. Your health care provider will set individualized treatment goals for you. Generally, the goal of treatment is to maintain the following blood glucose levels during pregnancy:  After not eating for 8 hours (after fasting): at or below 95 mg/dL (5.3 mmol/L).  After meals (postprandial): ? One hour after a meal: at or below 140 mg/dL (7.8 mmol/L). ? Two hours after a meal: at or below 120 mg/dL (6.7 mmol/L).  A1c (hemoglobin A1c) level: 6-6.5%.  What do I need to know about hyperglycemia and hypoglycemia? What is hyperglycemia? Hyperglycemia, also called high blood glucose, occurs when blood glucose is too high. Make sure you know the early signs of hyperglycemia, such as:  Increased thirst.  Hunger.  Feeling very tired.  Needing to urinate more often than usual.  Blurry vision.  What is hypoglycemia? Hypoglycemia, also called low blood glucose, occurswith a blood glucose level at or below 70 mg/dL (3.9 mmol/L). The risk for hypoglycemia increases during or after exercise, during sleep,  during illness, and when skipping meals or not eating for a long time (fasting). It is important to know the symptoms of hypoglycemia and treat it right away. Always have a 15-gram rapid-acting carbohydrate snack with you to treat low blood glucose.Family members and close friends should also know the symptoms and should understand how to treat hypoglycemia, in case you are not able to treat yourself. What are the symptoms of hypoglycemia? Hypoglycemia symptoms can include:  Hunger.  Anxiety.  Sweating and feeling clammy.  Confusion.  Dizziness or feeling light-headed.  Sleepiness.  Nausea.  Increased heart rate.  Headache.  Blurry vision.  Seizure.  Nightmares.  Tingling or numbness around the mouth, lips, or tongue.  A change in speech.  Decreased ability to concentrate.  A change in coordination.  Restless sleep.  Tremors or shakes.  Fainting.  Irritability.  How do I treat hypoglycemia?  If you are alert and able to swallow safely, follow the 15:15 rule:  Take 15 grams of a rapid-acting carbohydrate. Rapid-acting options include: ? 1 tube of glucose gel. ? 3 glucose pills. ? 6-8 pieces of hard candy. ? 4 oz (120 mL) of fruit juice. ? 4 oz (120 mL) of regular (not diet) soda.  Check your blood glucose 15 minutes after you take the carbohydrate.  If the repeat blood glucose level is still at or below 70 mg/dL (3.9 mmol/L), take 15 grams of a carbohydrate again.  If your blood glucose level does not increase above 70 mg/dL (3.9 mmol/L) after 3 tries, seek emergency medical care.  After your blood glucose level returns to normal,  eat a meal or a snack within 1 hour.  How do I treat severe hypoglycemia? Severe hypoglycemia is when your blood glucose level is at or below 54 mg/dL (3 mmol/L). Severe hypoglycemia is an emergency. Do not wait to see if the symptoms will go away. Get medical help right away. Call your local emergency services (911 in the  [place] Do not drive yourself to the hospital. If you have severe hypoglycemia and you cannot eat or drink, you may need an injection of glucagon. A family member or close friend should learn how to check your blood glucose and how to give you a glucagon injection. Ask your health care provider if you need to have an emergency glucagon injection kit available. Severe hypoglycemia may need to be treated in a hospital. The treatment may include getting glucose through an IV tube. You may also need treatment for the cause of your hypoglycemia. What else can I do to manage my gestational diabetes? Take your diabetes medicines as told  If your health care provider prescribed insulin or diabetes medicines, take them every day.  Do not run out of insulin or other diabetes medicines that you take. Plan ahead so you always have these available.  If you use insulin, adjust your dosage based on how physically active you are and what foods you eat. Your health care provider will tell you how to adjust your dosage. Make healthy food choices  The things that you eat and drink affect your blood glucose. Making good choices helps to control your diabetes and prevent other health problems. A healthy meal plan includes eating lean proteins, complex carbohydrates, fresh fruits and vegetables, low-fat dairy products, and healthy fats. Make an appointment to see a diet and nutrition specialist (registered dietitian) to help you create an eating plan that is right for you. Make sure that you:  Follow instructions from your health care provider about eating or drinking restrictions.  Drink enough fluid to keep your urine clear or pale yellow.  Eat healthy snacks between nutritious meals.  Track the carbohydrates that you eat. Do this by reading food labels and learning the standard serving sizes of foods.  Follow your sick day plan whenever you cannot eat or drink as usual. Make this plan in advance with your health  care provider.  Stay active   Do at least 30 minutes of physical activity a day, or as much physical activity as your health care provider recommends during your pregnancy. ? Doing 10 minutes of exercise 30 minutes after each meal may help to control postprandial blood glucose levels.  If you start a new exercise or activity, work with your health care provider to adjust your insulin, medicines, or food intake as needed. Make healthy lifestyle choices  Do not drink alcohol.  Do not use any tobacco products, such as cigarettes, chewing tobacco, and e-cigarettes. If you need help quitting, ask your health care provider.  Learn to manage stress. If you need help with this, ask your health care provider. Care for your body  Keep your immunizations up to date.  Brush your teeth and gums two times a day, and floss at least one time a day.  Visit your dentist at least once every 6 months.  Maintain a healthy weight during your pregnancy. General instructions   Take over-the-counter and prescription medicines only as told by your health care provider.  Talk with your health care provider about your risk for high blood pressure during pregnancy (preeclampsia  or eclampsia).  Share your diabetes management plan with people in your workplace, school, and household.  Check your urine for ketones during your pregnancy when you are ill and as told by your health care provider.  Carry a medical alert card or wear medical alert jewelry.  Ask your health care provider: ? Do I need to meet with a diabetes educator? ? Where can I find a support group for people with diabetes?  Keep all follow-up visits during your pregnancy (prenatal) and after delivery (postnatal) as told by your health care provider. This is important. Get the care that you need after delivery  Have your blood glucose level checked 4-12 weeks after delivery. This is done with an oral glucose tolerance test (OGTT).  Get  screened for diabetes at least every 3 years, or as often as told by your health care provider. Where to find more information: To learn more about gestational diabetes, visit:  American Diabetes Association (ADA): www.diabetes.org/diabetes-basics/gestational  Centers for Disease Control and Prevention (CDC): http://sanchez-watson.com/.pdf  This information is not intended to replace advice given to you by your health care provider. Make sure you discuss any questions you have with your health care provider. Document Released: 10/08/2015 Document Revised: 11/22/2015 Document Reviewed: 07/20/2015 Elsevier Interactive Patient Education  [employer].

## 2017-10-14 NOTE — Progress Notes (Signed)
   PRENATAL VISIT NOTE  Subjective:  Cathy RiggsLauren Park is a 27 y.o. G3P2002 at 2519w5d being seen today for ongoing prenatal care.  She is currently monitored for the following issues for this low-risk pregnancy and has Supervision of other normal pregnancy, antepartum; Uterine size date discrepancy; Encounter for fetal anatomic survey; Obesity affecting pregnancy in second trimester; [redacted] weeks gestation of pregnancy; and Gestational diabetes on their problem list.  Patient reports no complaints. Has multiple questions about GDM. Obtained her meter but has not had nutrition visit or teaching.   Contractions: Irregular.  .  Movement: Present. Denies leaking of fluid.   The following portions of the patient's history were reviewed and updated as appropriate: allergies, current medications, past family history, past medical history, past social history, past surgical history and problem list. Problem list updated.  Objective:   Vitals:   10/14/17 1315  BP: 107/71  Pulse: 72  Weight: 238 lb 6.4 oz (108.1 kg)    Fetal Status: Fetal Heart Rate (bpm): 161   Movement: Present     General:  Alert, oriented and cooperative. Patient is in no acute distress.  Skin: Skin is warm and dry. No rash noted.   Cardiovascular: Normal heart rate noted  Respiratory: Normal respiratory effort, no problems with respiration noted  Abdomen: Soft, gravid, appropriate for gestational age.  Pain/Pressure: Absent     Pelvic: Cervical exam deferred        Extremities: Normal range of motion.  Edema: Trace  Mental Status: Normal mood and affect. Normal behavior. Normal judgment and thought content.   Assessment and Plan:  Pregnancy: G3P2002 at 2519w5d  1. Obesity affecting pregnancy in second trimester TWG= 19 lb 6.4 oz (8.8 kg)   2. Uterine size-date discrepancy in third trimester Has fu US tomorrow  3. Supervision of other normal pregnancy, antepartum - US MFM OB FOLLOW UP; Future  4. Diet controlled gestational  diabetes mellitus (GDM) in third trimester Spent an extensive amount of time explaining GDM and schedule for checking sugars. Reviewed in detail diet modifications and importance of exercise and sleep. Patient voiced understanding. She cannot go to the lifestyles class due to having 2 toddlers at home. CMA to work on scheduling 1:1 visit with nutrition.   Patient will start checking BS 4x per day.  Return for review in clinic.   Preterm labor symptoms and general obstetric precautions including but not limited to vaginal bleeding, contractions, leaking of fluid and fetal movement were reviewed in detail with the patient. Please refer to After Visit Summary for other counseling recommendations.  Return in about 1 week (around 10/21/2017) for GDM f/u.  Future Appointments  Date Time Provider Department Center  10/15/2017  3:45 PM [facility] US 2 [facility]  10/21/2017  8:45 AM [facility] GDM CLASS NDM[facility] NDM    Federico FlakeKimberly Niles Francie Keeling, MD

## 2017-10-14 NOTE — Progress Notes (Signed)
Discuss Diabetes

## 2017-10-15 ENCOUNTER — Ambulatory Visit (HOSPITAL_COMMUNITY)
Admission: RE | Admit: 2017-10-15 | Discharge: 2017-10-15 | Disposition: A | Discharge status: Home / Self Care | Payer: Medicaid Other | Source: Ambulatory Visit | Attending: Nurse Practitioner | Admitting: Nurse Practitioner

## 2017-10-15 ENCOUNTER — Ambulatory Visit: Payer: Medicaid Other | Admitting: Registered"

## 2017-10-15 ENCOUNTER — Other Ambulatory Visit: Payer: Self-pay | Admitting: Nurse Practitioner

## 2017-10-15 DIAGNOSIS — Z362 Encounter for other antenatal screening follow-up: Secondary | ICD-10-CM

## 2017-10-15 DIAGNOSIS — O99213 Obesity complicating pregnancy, third trimester: Secondary | ICD-10-CM | POA: Diagnosis not present

## 2017-10-15 DIAGNOSIS — Z3A30 30 weeks gestation of pregnancy: Secondary | ICD-10-CM | POA: Diagnosis not present

## 2017-10-15 DIAGNOSIS — Z3480 Encounter for supervision of other normal pregnancy, unspecified trimester: Secondary | ICD-10-CM

## 2017-10-15 DIAGNOSIS — O24410 Gestational diabetes mellitus in pregnancy, diet controlled: Secondary | ICD-10-CM

## 2017-10-21 ENCOUNTER — Ambulatory Visit: Payer: Medicaid Other

## 2017-10-21 ENCOUNTER — Ambulatory Visit (INDEPENDENT_AMBULATORY_CARE_PROVIDER_SITE_OTHER): Payer: Medicaid Other | Admitting: Student

## 2017-10-21 VITALS — BP 108/72 | HR 96 | Wt 235.4 lb

## 2017-10-21 DIAGNOSIS — O24419 Gestational diabetes mellitus in pregnancy, unspecified control: Secondary | ICD-10-CM

## 2017-10-21 DIAGNOSIS — Z3480 Encounter for supervision of other normal pregnancy, unspecified trimester: Secondary | ICD-10-CM

## 2017-10-21 MED ORDER — METFORMIN HCL 500 MG PO TABS: 500.0000 mg | ORAL_TABLET | Freq: Every day | ORAL | 2 refills | Status: DC

## 2017-10-21 NOTE — Progress Notes (Signed)
   PRENATAL VISIT NOTE  Subjective:  Cathy Park is a 27 y.o. G3P2002 at 4081w5d being seen today for ongoing prenatal care.  She is currently monitored for the following issues for this high-risk pregnancy and has Supervision of other normal pregnancy, antepartum; Uterine size date discrepancy; Encounter for fetal anatomic survey; Obesity affecting pregnancy in second trimester; [redacted] weeks gestation of pregnancy; and Gestational diabetes mellitus, class A2 on their problem list.  She says that she is doing well with her portions and exercising 1 mile a day walk.   Patient reports no complaints.  Contractions: Irregular.  .  Movement: Present. Denies leaking of fluid.   The following portions of the patient's history were reviewed and updated as appropriate: allergies, current medications, past family history, past medical history, past social history, past surgical history and problem list. Problem list updated.  Objective:   Vitals:   10/21/17 1624  BP: 108/72  Pulse: 96  Weight: 235 lb 6.4 oz (106.8 kg)    Fetal Status: Fetal Heart Rate (bpm): 141   Movement: Present     General:  Alert, oriented and cooperative. Patient is in no acute distress.  Skin: Skin is warm and dry. No rash noted.   Cardiovascular: Normal heart rate noted  Respiratory: Normal respiratory effort, no problems with respiration noted  Abdomen: Soft, gravid, appropriate for gestational age.  Pain/Pressure: Absent     Pelvic: Cervical exam deferred        Extremities: Normal range of motion.  Edema: Trace  Mental Status: Normal mood and affect. Normal behavior. Normal judgment and thought content.   Assessment and Plan:  Pregnancy: G3P2002 at 7381w5d  1. Diet controlled gestational diabetes mellitus (GDM) in third trimester Reviewed 6 days of blood sugar logs:  Fasting blood sugar:  108 103 103 94 107 98 2 hour pp well controlled: highest is 171, others are 128 131 122  but all other values (greater than 50%)   are less than 120.   Weight gain excellent.   Discussed fasting blood sugar with Dr. Macon LargeAnyanwu, who recommends patient start insulin 5 units NPH. Explained to patient that this was the gold standard of GDM care; patient does not want to do insulin and prefers to try metformin first. Emphasized to patient that insulin may become necessary but that she can try metformin for 2 weeks. Continue her diet as 2 hour PP are excellent.   [facility] [place] due to patient's preference for female providers. Patient counseled that we cannot guarantee female provider every visit.   2. Supervision of other normal pregnancy, antepartum Doing well; had follow up US   Receptionist [facility] to [facility]. Will send message to Admin pool to call patient for BPP next week and HROB visit in two weeks.    Preterm labor symptoms and general obstetric precautions including but not limited to vaginal bleeding, contractions, leaking of fluid and fetal movement were reviewed in detail with the patient. Please refer to After Visit Summary for other counseling recommendations.    Return in about 1 week (around 10/28/2017), or 1 week for BPP and 2 weeks for HROB appt with MD.  No future appointments.  Marylene LandKathryn Lorraine Zamani Crocker, CNM

## 2017-10-21 NOTE — Patient Instructions (Signed)

## 2017-10-29 ENCOUNTER — Ambulatory Visit (INDEPENDENT_AMBULATORY_CARE_PROVIDER_SITE_OTHER): Payer: Medicaid Other | Admitting: General Practice

## 2017-10-29 ENCOUNTER — Ambulatory Visit: Payer: Self-pay

## 2017-10-29 VITALS — BP 114/66

## 2017-10-29 DIAGNOSIS — O24419 Gestational diabetes mellitus in pregnancy, unspecified control: Secondary | ICD-10-CM

## 2017-10-29 NOTE — Progress Notes (Signed)
Pt informed that the ultrasound is considered a limited OB ultrasound and is not intended to be a complete ultrasound exam.  Patient also informed that the ultrasound is not being completed with the intent of assessing for fetal or placental anomalies or any pelvic abnormalities.  Explained that the purpose of today's ultrasound is to assess for  BPP, presentation and AFI.  Patient acknowledges the purpose of the exam and the limitations of the study.    

## 2017-11-05 ENCOUNTER — Ambulatory Visit: Payer: Self-pay

## 2017-11-05 ENCOUNTER — Ambulatory Visit (INDEPENDENT_AMBULATORY_CARE_PROVIDER_SITE_OTHER): Payer: Medicaid Other | Admitting: *Deleted

## 2017-11-05 ENCOUNTER — Ambulatory Visit (INDEPENDENT_AMBULATORY_CARE_PROVIDER_SITE_OTHER): Payer: Medicaid Other | Admitting: Obstetrics and Gynecology

## 2017-11-05 VITALS — BP 123/64 | HR 98 | Wt 235.3 lb

## 2017-11-05 DIAGNOSIS — O24419 Gestational diabetes mellitus in pregnancy, unspecified control: Secondary | ICD-10-CM

## 2017-11-05 DIAGNOSIS — O329XX0 Maternal care for malpresentation of fetus, unspecified, not applicable or unspecified: Secondary | ICD-10-CM

## 2017-11-05 DIAGNOSIS — Z3480 Encounter for supervision of other normal pregnancy, unspecified trimester: Secondary | ICD-10-CM

## 2017-11-05 NOTE — Progress Notes (Signed)

## 2017-11-05 NOTE — Progress Notes (Signed)
Prenatal Visit Note Date: 11/06/2017 Clinic: [facility]  Subjective:  Cathy Park is a 27 y.o. G3P2002 at [redacted]w[redacted]d being seen today for ongoing prenatal care.  She is currently monitored for the following issues for this high-risk pregnancy and has Supervision of other normal pregnancy, antepartum; Obesity affecting pregnancy in second trimester; Gestational diabetes mellitus, class A2; and Malpresentation before onset of labor on their problem list.  Patient reports no complaints.   Contractions: Not present. Vag. Bleeding: None.  Movement: Present. Denies leaking of fluid.   The following portions of the patient's history were reviewed and updated as appropriate: allergies, current medications, past family history, past medical history, past social history, past surgical history and problem list. Problem list updated.  Objective:   Vitals:   11/05/17 1512  BP: 123/64  Pulse: 98  Weight: 235 lb 4.8 oz (106.7 kg)    Fetal Status: Fetal Heart Rate (bpm): NST   Movement: Present     General:  Alert, oriented and cooperative. Patient is in no acute distress.  Skin: Skin is warm and dry. No rash noted.   Cardiovascular: Normal heart rate noted  Respiratory: Normal respiratory effort, no problems with respiration noted  Abdomen: Soft, gravid, appropriate for gestational age. Pain/Pressure: Present     Pelvic:  Cervical exam deferred        Extremities: Normal range of motion.     Mental Status: Normal mood and affect. Normal behavior. Normal judgment and thought content.   Urinalysis:      Assessment and Plan:  Pregnancy: G3P2002 at [redacted]w[redacted]d  1. Supervision of other normal pregnancy, antepartum Routine care. BTL papers UTD - [place] MFM OB FOLLOW UP; Future - [place] MFM FETAL BPP WO NON STRESS; Future  2. Gestational diabetes mellitus, class A2 Normal bs log on metformin 500 qhs but when she "cheats" and has a poor diet 2hr pp in the 120s-140s. D/w her re: diet and she  knows to keep closer watch on it. bpp 10/10. Repeat growth scheduled for 5/23 - [place] MFM OB FOLLOW UP; Future - [place] MFM FETAL BPP WO NON STRESS; Future  3. Malpresentation before onset of labor, single or unspecified fetus Frank breech. F/u at subsequent scans  Preterm labor symptoms and general obstetric precautions including but not limited to vaginal bleeding, contractions, leaking of fluid and fetal movement were reviewed in detail with the patient. Please refer to After Visit Summary for other counseling recommendations.  1wk rob/afi/nst   [place] Bing, MD   500 qhs

## 2017-11-12 ENCOUNTER — Ambulatory Visit: Payer: Self-pay

## 2017-11-12 ENCOUNTER — Ambulatory Visit (INDEPENDENT_AMBULATORY_CARE_PROVIDER_SITE_OTHER): Payer: Medicaid Other | Admitting: *Deleted

## 2017-11-12 VITALS — BP 127/65 | HR 100

## 2017-11-12 DIAGNOSIS — O24419 Gestational diabetes mellitus in pregnancy, unspecified control: Secondary | ICD-10-CM

## 2017-11-12 NOTE — Progress Notes (Signed)

## 2017-11-19 ENCOUNTER — Ambulatory Visit (HOSPITAL_COMMUNITY)
Admission: RE | Admit: 2017-11-19 | Discharge: 2017-11-19 | Disposition: A | Discharge status: Home / Self Care | Payer: Medicaid Other | Source: Ambulatory Visit | Attending: Obstetrics and Gynecology | Admitting: Obstetrics and Gynecology

## 2017-11-19 ENCOUNTER — Encounter (HOSPITAL_COMMUNITY): Payer: Self-pay

## 2017-11-19 ENCOUNTER — Other Ambulatory Visit: Payer: Self-pay | Admitting: Obstetrics and Gynecology

## 2017-11-19 ENCOUNTER — Ambulatory Visit (INDEPENDENT_AMBULATORY_CARE_PROVIDER_SITE_OTHER): Payer: Medicaid Other | Admitting: *Deleted

## 2017-11-19 ENCOUNTER — Other Ambulatory Visit (HOSPITAL_COMMUNITY)
Admission: RE | Admit: 2017-11-19 | Discharge: 2017-11-19 | Disposition: A | Discharge status: Home / Self Care | Payer: Medicaid Other | Source: Ambulatory Visit | Attending: Obstetrics and Gynecology | Admitting: Obstetrics and Gynecology

## 2017-11-19 ENCOUNTER — Ambulatory Visit (INDEPENDENT_AMBULATORY_CARE_PROVIDER_SITE_OTHER): Payer: Medicaid Other | Admitting: Obstetrics and Gynecology

## 2017-11-19 VITALS — BP 125/65 | HR 90 | Wt 237.0 lb

## 2017-11-19 DIAGNOSIS — O24419 Gestational diabetes mellitus in pregnancy, unspecified control: Secondary | ICD-10-CM

## 2017-11-19 DIAGNOSIS — Z3A35 35 weeks gestation of pregnancy: Secondary | ICD-10-CM | POA: Insufficient documentation

## 2017-11-19 DIAGNOSIS — Z3483 Encounter for supervision of other normal pregnancy, third trimester: Secondary | ICD-10-CM | POA: Insufficient documentation

## 2017-11-19 DIAGNOSIS — Z362 Encounter for other antenatal screening follow-up: Secondary | ICD-10-CM

## 2017-11-19 DIAGNOSIS — O99212 Obesity complicating pregnancy, second trimester: Secondary | ICD-10-CM

## 2017-11-19 DIAGNOSIS — O0993 Supervision of high risk pregnancy, unspecified, third trimester: Secondary | ICD-10-CM

## 2017-11-19 DIAGNOSIS — O99213 Obesity complicating pregnancy, third trimester: Secondary | ICD-10-CM

## 2017-11-19 DIAGNOSIS — Z6841 Body Mass Index (BMI) 40.0 and over, adult: Secondary | ICD-10-CM | POA: Insufficient documentation

## 2017-11-19 DIAGNOSIS — O24415 Gestational diabetes mellitus in pregnancy, controlled by oral hypoglycemic drugs: Secondary | ICD-10-CM | POA: Diagnosis not present

## 2017-11-19 DIAGNOSIS — Z3480 Encounter for supervision of other normal pregnancy, unspecified trimester: Secondary | ICD-10-CM | POA: Diagnosis present

## 2017-11-19 LAB — POCT URINALYSIS DIP (DEVICE)
Bilirubin Urine: NEGATIVE
Glucose, UA: NEGATIVE mg/dL
Hgb urine dipstick: NEGATIVE
KETONES UR: NEGATIVE mg/dL
Nitrite: NEGATIVE
PH: 6 (ref 5.0–8.0)
PROTEIN: NEGATIVE mg/dL
SPECIFIC GRAVITY, URINE: 1.02 (ref 1.005–1.030)
Urobilinogen, UA: 0.2 mg/dL (ref 0.0–1.0)

## 2017-11-19 MED ORDER — METFORMIN HCL 500 MG PO TABS: ORAL_TABLET | ORAL | 2 refills | Status: DC

## 2017-11-19 NOTE — Progress Notes (Signed)
[place] for growth and BPP (8/8 reported by MFM) done today.

## 2017-11-19 NOTE — Progress Notes (Signed)
Prenatal Visit Note Date: 11/19/2017 Clinic: [facility]  Subjective:  Cathy Park is a 27 y.o. G3P2002 at [redacted]w[redacted]d being seen today for ongoing prenatal care.  She is currently monitored for the following issues for this high-risk pregnancy and has Supervision of high risk pregnancy, antepartum, third trimester; Obesity affecting pregnancy in second trimester; Gestational diabetes mellitus, class A2; and BMI 40.0-44.9, adult (HCC) on their problem list.  Patient reports no complaints.   Contractions: Not present. Vag. Bleeding: None.  Movement: Present. Denies leaking of fluid.   The following portions of the patient's history were reviewed and updated as appropriate: allergies, current medications, past family history, past medical history, past social history, past surgical history and problem list. Problem list updated.  Objective:   Vitals:   11/19/17 1556  BP: 125/65  Pulse: 90  Weight: 237 lb (107.5 kg)    Fetal Status: Fetal Heart Rate (bpm): NST   Movement: Present  Presentation: Vertex  General:  Alert, oriented and cooperative. Patient is in no acute distress.  Skin: Skin is warm and dry. No rash noted.   Cardiovascular: Normal heart rate noted  Respiratory: Normal respiratory effort, no problems with respiration noted  Abdomen: Soft, gravid, appropriate for gestational age. Pain/Pressure: Present     Pelvic:  Cervical exam deferred        Extremities: Normal range of motion.     Mental Status: Normal mood and affect. Normal behavior. Normal judgment and thought content.   Urinalysis: Urine Protein: Negative Urine Glucose: Negative  Assessment and Plan:  Pregnancy: G3P2002 at [redacted]w[redacted]d  1. Supervision of other normal pregnancy, antepartum Routine care. BTL papers UTD - GC/Chlamydia probe amp ([place])not at [facility] - Strep Gp B NAA  2. Gestational diabetes mellitus, class A2 AM fastings and 2hr PPs about 0-10 above range. Will go from metformin  500/500 to 1000/1000. rNST and bpp 8/8 today and cephalic. Growth scan prelim efw 87% and AC in the 90s with normal AFI. Bpp/nst nv  3. Obesity affecting pregnancy in second trimester  4. BMI 40.0-44.9, adult (HCC)  5. Supervision of high risk pregnancy, antepartum, third trimester  Term labor symptoms and general obstetric precautions including but not limited to vaginal bleeding, contractions, leaking of fluid and fetal movement were reviewed in detail with the patient. Please refer to After Visit Summary for other counseling recommendations.  Return in about 1 week (around 11/26/2017) for weekly as scheduled.   Lavallette Bing, MD

## 2017-11-20 LAB — GC/CHLAMYDIA PROBE AMP (~~LOC~~) NOT AT ARMC
CHLAMYDIA, DNA PROBE: NEGATIVE
Neisseria Gonorrhea: NEGATIVE

## 2017-11-21 LAB — STREP GP B NAA: STREP GROUP B AG: NEGATIVE

## 2017-11-26 ENCOUNTER — Ambulatory Visit (INDEPENDENT_AMBULATORY_CARE_PROVIDER_SITE_OTHER): Payer: Medicaid Other | Admitting: Obstetrics & Gynecology

## 2017-11-26 ENCOUNTER — Ambulatory Visit (INDEPENDENT_AMBULATORY_CARE_PROVIDER_SITE_OTHER): Payer: Medicaid Other | Admitting: *Deleted

## 2017-11-26 ENCOUNTER — Ambulatory Visit: Payer: Self-pay

## 2017-11-26 VITALS — BP 136/67 | HR 114 | Wt 238.6 lb

## 2017-11-26 DIAGNOSIS — O24419 Gestational diabetes mellitus in pregnancy, unspecified control: Secondary | ICD-10-CM

## 2017-11-26 DIAGNOSIS — O0993 Supervision of high risk pregnancy, unspecified, third trimester: Secondary | ICD-10-CM

## 2017-11-26 MED ORDER — ONDANSETRON 8 MG PO TBDP: 8.0000 mg | ORAL_TABLET | Freq: Three times a day (TID) | ORAL | 1 refills | Status: DC | PRN

## 2017-11-26 MED ORDER — GLYBURIDE 2.5 MG PO TABS: 1.2500 mg | ORAL_TABLET | Freq: Every day | ORAL | 3 refills | Status: DC

## 2017-11-26 NOTE — Progress Notes (Signed)
PRENATAL VISIT NOTE  Subjective:  Cathy Park is a 27 y.o. G3P2002 at [redacted]w[redacted]d being seen today for ongoing prenatal care.  She is currently monitored for the following issues for this high-risk pregnancy and has Supervision of high risk pregnancy, antepartum, third trimester; Obesity affecting pregnancy in second trimester; Gestational diabetes mellitus, class A2; and BMI 40.0-44.9, adult (HCC) on their problem list.  Patient reports nausea, wants refill of Zofran.  Contractions: Irregular. Vag. Bleeding: None.  Movement: Present. Denies leaking of fluid.   The following portions of the patient's history were reviewed and updated as appropriate: allergies, current medications, past family history, past medical history, past social history, past surgical history and problem list. Problem list updated.  Objective:   Vitals:   11/26/17 0906  BP: 136/67  Pulse: (!) 114  Weight: 238 lb 9.6 oz (108.2 kg)    Fetal Status: Fetal Heart Rate (bpm): NST   Movement: Present     General:  Alert, oriented and cooperative. Patient is in no acute distress.  Skin: Skin is warm and dry. No rash noted.   Cardiovascular: Normal heart rate noted  Respiratory: Normal respiratory effort, no problems with respiration noted  Abdomen: Soft, gravid, appropriate for gestational age.  Pain/Pressure: Present     Pelvic: Cervical exam deferred        Extremities: Normal range of motion.     Mental Status: Normal mood and affect. Normal behavior. Normal judgment and thought content.  [place] Mfm Fetal Bpp Wo Non Stress  Result Date: 11/19/2017 ----------------------------------------------------------------------  OBSTETRICS REPORT                      (Signed Final 11/19/2017 05:33 pm) ---------------------------------------------------------------------- Patient Info  ID #:       [id number]                          D.O.B.:  Oct 11, 1990 (26 yrs)  Name:       Cathy Park                  Visit Date: 11/19/2017 02:30 pm  ---------------------------------------------------------------------- Performed By  Performed By:     Eden Lathe BS      Ref. Address:     [facility]                    RDMS RVT                                                             OB/Gyn Clinic                                                             [address]                                                             [place], [place]                                                             [address]  Attending:        Erle Crocker MD     Location:         [facility]  Referred By:      [facility] for                    [facility]                    Healthcare ---------------------------------------------------------------------- Orders   #  Description                                 Code   1  [place] MFM OB FOLLOW UP                         60454.09   2  [place] MFM FETAL BPP WO NON STRESS              81191.47  ----------------------------------------------------------------------   #  Ordered By               Order #        Accession #    Episode #   1  [place] Bing          [id number]      [id number]     [id number]   2  CHARLIE PICKENS          [id number]      [id number]     [id number]  ---------------------------------------------------------------------- Indications   [redacted] weeks gestation of pregnancy                Z3A.35   Gestational diabetes in pregnancy,             O24.415   controlled by oral hypoglycemic drugs   Obesity complicating pregnancy, third          O99.213   trimester   Encounter for other antenatal screening        Z36.2   follow-up  ---------------------------------------------------------------------- OB History  Gravidity:    3         Term:   2  Living:       2 ---------------------------------------------------------------------- Fetal Evaluation  Num Of Fetuses:     1  Fetal Heart         157  Rate(bpm):  Cardiac Activity:    Observed  Presentation:       Cephalic  Placenta:           Posterior, above cervical os  P. Cord Insertion:  Visualized  Amniotic  Fluid  AFI FV:      Subjectively within normal limits  AFI Sum(cm)     %Tile       Largest Pocket(cm)  18.29           68          5.88  RUQ(cm)       RLQ(cm)       LUQ(cm)        LLQ(cm)  5.88          3.18          5.15           4.08 ---------------------------------------------------------------------- Biophysical Evaluation  Amniotic F.V:   Within normal limits       F. Tone:        Observed  F. Movement:    Observed                   Score:          8/8  F. Breathing:   Observed ---------------------------------------------------------------------- Biometry  BPD:      92.5  mm     G. Age:  37w 4d         93  %    CI:        73.17   %    70 - 86                                                          FL/HC:      19.5   %    20.1 - 22.1  HC:      343.7  mm     G. Age:  39w 5d         94  %    HC/AC:      1.01        0.93 - 1.11  AC:      339.3  mm     G. Age:  37w 6d         95  %    FL/BPD:     72.4   %    71 - 87  FL:         67  mm     G. Age:  34w 4d         15  %    FL/AC:      19.7   %    20 - 24  HUM:      58.3  mm     G. Age:  33w 5d         28  %  Est. FW:    3147  gm    6 lb 15 oz      85  % ---------------------------------------------------------------------- Gestational Age  LMP:           35w 6d        Date:  03/13/17                 EDD:   12/18/17  U/S Today:     37w 3d  EDD:   12/07/17  Best:          35w 6d     Det. By:  LMP  (03/13/17)          EDD:   12/18/17 ---------------------------------------------------------------------- Anatomy  Cranium:               Appears normal         Aortic Arch:            Previously seen  Cavum:                 Previously seen        Ductal Arch:            Previously seen  Ventricles:            Appears normal         Diaphragm:              Previously seen  Choroid Plexus:         Previously seen        Stomach:                Appears normal, left                                                                        sided  Cerebellum:            Previously seen        Abdomen:                Previously seen  Posterior Fossa:       Previously seen        Abdominal Wall:         Previously seen  Nuchal Fold:           Not applicable (>20    Cord Vessels:           Previously seen                         wks GA)  Face:                  Orbits previously      Kidneys:                Appear normal                         seen  Lips:                  Previously seen        Bladder:                Appears normal  Thoracic:              Appears normal         Spine:                  Limited views  appear normal  Heart:                 Appears normal         Upper Extremities:      Previously seen                         (4CH, axis, and situs  RVOT:                  Not well visualized    Lower Extremities:      Previously seen  LVOT:                  Previously seen  Other:  Heels and 5th digit previously visualized. Nasal bone previously          visualized. Open hands previously visualized. Technically difficult due          to maternal habitus and fetal position. ---------------------------------------------------------------------- Cervix Uterus Adnexa  Cervix  Not visualized (advanced GA >29wks)  Uterus  No abnormality visualized.  Left Ovary  Within normal limits.  Right Ovary  Not visualized.  Cul De Sac:   No free fluid seen.  Adnexa:       No abnormality visualized. ---------------------------------------------------------------------- Impression  Indication: 27 yr old G62P2002 at [redacted]w[redacted]d with gestational  diabetes A2 for fetal growth and BPP.  Findings:  1. Single intrauterine pregnancy with normal cardiac activity.  2. Estimated fetal weight is in the 85th%.  3. Posterior placenta without evidence of previa.  4. Normal  amniotic fluid index.  5. The anatomy survey remains limited as above; no  abnormalities seen on limited exam.  6. Normal biophysical profile of 8/8. ---------------------------------------------------------------------- Recommendations  1. Appropriate fetal growth.  2. Normal limited anatomy survey.  3. Gestational diabetes:  - on metformin  - recommend continue antenatal testing  4. Low risk cell free fetal DNA ----------------------------------------------------------------------                Erle Crocker, MD Electronically Signed Final Report   11/19/2017 05:33 pm ----------------------------------------------------------------------  [place] Mfm Ob Follow Up  Result Date: 11/19/2017 ----------------------------------------------------------------------  OBSTETRICS REPORT                      (Signed Final 11/19/2017 05:33 pm) ---------------------------------------------------------------------- Patient Info  ID #:       [id number]                          D.O.B.:  [birth year] (26 yrs)  Name:       Cathy Park                  Visit Date: 11/19/2017 02:30 pm ---------------------------------------------------------------------- Performed By  Performed By:     Eden Lathe BS      Ref. Address:     [facility]                    RDMS RVT                                                             OB/Gyn Clinic  [address]                                                             [place], [place]                                                             [address]  Attending:        Erle Crocker MD     Location:         [facility]  Referred By:      [facility] for                    [facility]                    Healthcare ---------------------------------------------------------------------- Orders   #  Description                                  Code   1  [place] MFM OB FOLLOW UP                         19147.82   2  [place] MFM FETAL BPP WO NON STRESS              95621.30  ----------------------------------------------------------------------   #  Ordered By               Order #        Accession #    Episode #   1  [place] Bing          [id number]      [id number]     [id number]   2  CHARLIE PICKENS          [id number]      [id number]     [id number]  ---------------------------------------------------------------------- Indications   [redacted] weeks gestation of pregnancy                Z3A.35   Gestational diabetes in pregnancy,             O24.415   controlled by oral hypoglycemic drugs   Obesity complicating pregnancy, third          O99.213   trimester   Encounter for other antenatal screening        Z36.2   follow-up  ---------------------------------------------------------------------- OB History  Gravidity:    3         Term:   2  Living:       2 ---------------------------------------------------------------------- Fetal  Evaluation  Num Of Fetuses:     1  Fetal Heart         157  Rate(bpm):  Cardiac Activity:   Observed  Presentation:       Cephalic  Placenta:           Posterior, above cervical os  P. Cord Insertion:  Visualized  Amniotic Fluid  AFI FV:      Subjectively within normal limits  AFI Sum(cm)     %Tile       Largest Pocket(cm)  18.29           68          5.88  RUQ(cm)       RLQ(cm)       LUQ(cm)        LLQ(cm)  5.88          3.18          5.15           4.08 ---------------------------------------------------------------------- Biophysical Evaluation  Amniotic F.V:   Within normal limits       F. Tone:        Observed  F. Movement:    Observed                   Score:          8/8  F. Breathing:   Observed ---------------------------------------------------------------------- Biometry  BPD:      92.5  mm     G. Age:  37w 4d         93  %    CI:        73.17   %    70 - 86                                                          FL/HC:      19.5    %    20.1 - 22.1  HC:      343.7  mm     G. Age:  39w 5d         94  %    HC/AC:      1.01        0.93 - 1.11  AC:      339.3  mm     G. Age:  37w 6d         95  %    FL/BPD:     72.4   %    71 - 87  FL:         67  mm     G. Age:  34w 4d         15  %    FL/AC:      19.7   %    20 - 24  HUM:      58.3  mm     G. Age:  33w 5d         28  %  Est. FW:    3147  gm    6 lb 15 oz      85  % ---------------------------------------------------------------------- Gestational Age  LMP:           35w 6d        Date:  03/13/17  EDD:   12/18/17  U/S Today:     37w 3d                                        EDD:   12/07/17  Best:          35w 6d     Det. By:  LMP  (03/13/17)          EDD:   12/18/17 ---------------------------------------------------------------------- Anatomy  Cranium:               Appears normal         Aortic Arch:            Previously seen  Cavum:                 Previously seen        Ductal Arch:            Previously seen  Ventricles:            Appears normal         Diaphragm:              Previously seen  Choroid Plexus:        Previously seen        Stomach:                Appears normal, left                                                                        sided  Cerebellum:            Previously seen        Abdomen:                Previously seen  Posterior Fossa:       Previously seen        Abdominal Wall:         Previously seen  Nuchal Fold:           Not applicable (>20    Cord Vessels:           Previously seen                         wks GA)  Face:                  Orbits previously      Kidneys:                Appear normal                         seen  Lips:                  Previously seen        Bladder:                Appears normal  Thoracic:              Appears normal         Spine:  Limited views                                                                        appear normal  Heart:                 Appears normal         Upper Extremities:       Previously seen                         (4CH, axis, and situs  RVOT:                  Not well visualized    Lower Extremities:      Previously seen  LVOT:                  Previously seen  Other:  Heels and 5th digit previously visualized. Nasal bone previously          visualized. Open hands previously visualized. Technically difficult due          to maternal habitus and fetal position. ---------------------------------------------------------------------- Cervix Uterus Adnexa  Cervix  Not visualized (advanced GA >29wks)  Uterus  No abnormality visualized.  Left Ovary  Within normal limits.  Right Ovary  Not visualized.  Cul De Sac:   No free fluid seen.  Adnexa:       No abnormality visualized. ---------------------------------------------------------------------- Impression  Indication: 27 yr old G29P2002 at [redacted]w[redacted]d with gestational  diabetes A2 for fetal growth and BPP.  Findings:  1. Single intrauterine pregnancy with normal cardiac activity.  2. Estimated fetal weight is in the 85th%.  3. Posterior placenta without evidence of previa.  4. Normal amniotic fluid index.  5. The anatomy survey remains limited as above; no  abnormalities seen on limited exam.  6. Normal biophysical profile of 8/8. ---------------------------------------------------------------------- Recommendations  1. Appropriate fetal growth.  2. Normal limited anatomy survey.  3. Gestational diabetes:  - on metformin  - recommend continue antenatal testing  4. Low risk cell free fetal DNA ----------------------------------------------------------------------                Erle Crocker, MD Electronically Signed Final Report   11/19/2017 05:33 pm ----------------------------------------------------------------------  US Fetal Bpp W/nonstress  Result Date: 11/16/2017 ----------------------------------------------------------------------  OBSTETRICS REPORT                      (Signed Final 11/16/2017 08:35 am)  ---------------------------------------------------------------------- Patient Info  ID #:       [id number]                          D.O.B.:  08-29-1990 (26 yrs)  Name:       Cathy Park                  Visit Date: 11/12/2017 05:00 pm ---------------------------------------------------------------------- Performed By  Performed By:     Sedalia Muta Day RNC          Ref. Address:     [facility]  OB/Gyn Clinic                                                             [address]                                                             [place], [place]                                                             [address]  Attending:        Jaynie Collins         Location:         [facility]                    MD                                       [facility]  Referred By:      [facility] for                    [facility]                    Healthcare ---------------------------------------------------------------------- Orders   #  Description                                 Code   1  US FETAL BPP W/NONSTRESS                    13086.5  ----------------------------------------------------------------------   #  Ordered By               Order #        Accession #    Episode #   1  Jaynie Collins           [id number]      [id number]  [id number]  ---------------------------------------------------------------------- Service(s) Provided   US Fetal BPP W NST                                   [id number]  ---------------------------------------------------------------------- Indications   [redacted] weeks gestation of pregnancy                Z3A.34   Gestational diabetes in pregnancy,             O24.415   controlled by oral hypoglycemic drugs   ---------------------------------------------------------------------- OB History  Gravidity:    3         Term:   2  Living:       2 ---------------------------------------------------------------------- Fetal Evaluation  Num Of Fetuses:     1  Preg. Location:     Intrauterine  Fetal Heart         152  Rate(bpm):  Cardiac Activity:   Observed  Presentation:       Cephalic  Amniotic Fluid  AFI FV:      Subjectively within normal limits  AFI Sum(cm)     %Tile       Largest Pocket(cm)  12.39           38          3.88  RUQ(cm)       RLQ(cm)       LUQ(cm)        LLQ(cm)  3.45          3.88          2.43           2.63 ---------------------------------------------------------------------- Biophysical Evaluation  Amniotic F.V:   Pocket => 2 cm two         F. Tone:        Observed                  planes  F. Movement:    Observed                   N.S.T:          Reactive  F. Breathing:   Not Observed               Score:          8/10 ---------------------------------------------------------------------- Gestational Age  LMP:           34w 6d        Date:  03/13/17                 EDD:   12/18/17  Best:          34w 6d     Det. By:  LMP  (03/13/17)          EDD:   12/18/17 ---------------------------------------------------------------------- Impression  IUP at  [redacted]w[redacted]d  Normal amniotic fluid volume  BPP 8/10 ---------------------------------------------------------------------- Recommendations  Continue recommended antenatal testing and delivery plan ----------------------------------------------------------------------               Jaynie Collins, MD Electronically Signed Final Report   11/16/2017 08:35 am ----------------------------------------------------------------------  US Fetal Bpp W/nonstress  Result Date: 11/09/2017 ----------------------------------------------------------------------  OBSTETRICS REPORT                      (Signed Final 11/09/2017 01:31 pm)  ---------------------------------------------------------------------- Patient Info  ID #:       [id number]  D.O.B.:  Nov 04, 1990 (26 yrs)  Name:       Cathy Park                  Visit Date: 11/05/2017 03:53 pm ---------------------------------------------------------------------- Performed By  Performed By:     Sedalia Muta Day RNC          Ref. Address:     [facility]                                                             [address]                                                             [place], [place]                                                             [address]  Attending:        [place] Bing MD     Location:         [facility]                                                             [facility]  Referred By:      [facility] for                    [facility]                    Healthcare ---------------------------------------------------------------------- Orders   #  Description                                 Code   1  US FETAL BPP W/NONSTRESS                    91478.2  ----------------------------------------------------------------------   #  Ordered By               Order #        Accession #    Episode #   1  [place] Bing          [id number]      [id number]     [id number]  ---------------------------------------------------------------------- Service(s) Provided   US Fetal BPP W NST                                   [phone]  ---------------------------------------------------------------------- Indications   [redacted] weeks gestation of pregnancy                Z3A.33   Gestational diabetes in pregnancy,             O24.415   controlled by oral hypoglycemic drugs   ---------------------------------------------------------------------- OB History  Gravidity:    3         Term:   2  Living:       2 ---------------------------------------------------------------------- Fetal Evaluation  Num Of Fetuses:     1  Preg. Location:     Intrauterine  Cardiac Activity:   Observed  Presentation:       Homero Fellers breech  Amniotic Fluid  AFI FV:      Subjectively within normal limits  AFI Sum(cm)     %Tile       Largest Pocket(cm)  12.58           38          5.99  RUQ(cm)                     LUQ(cm)        LLQ(cm)  3.42                        5.99           3.17 ---------------------------------------------------------------------- Biophysical Evaluation  Amniotic F.V:   Pocket => 2 cm two         F. Tone:        Observed                  planes  F. Movement:    Observed                   N.S.T:          Reactive  F. Breathing:   Observed                   Score:          10/10 ---------------------------------------------------------------------- Gestational Age  LMP:           33w 6d        Date:  03/13/17                 EDD:   12/18/17  Best:          33w 6d     Det.  By:  LMP  (03/13/17)          EDD:   12/18/17 ---------------------------------------------------------------------- Impression  Reassuring antenatal testing ---------------------------------------------------------------------- Recommendations  Continue with weekly testing. ----------------------------------------------------------------------                [place] Bing, MD Electronically Signed Final Report   11/09/2017 01:31 pm ----------------------------------------------------------------------  US Fetal Bpp W/nonstress  Result Date: 11/04/2017 ----------------------------------------------------------------------  OBSTETRICS REPORT                      (Signed Final 11/04/2017 03:37 pm) ---------------------------------------------------------------------- Patient Info  ID #:       [id number]                           D.O.B.:  12-Jan-1991 (26 yrs)  Name:       Cathy Park                  Visit Date: 10/29/2017 02:00 pm ---------------------------------------------------------------------- Performed By  Performed By:     Marylynn Pearson RN      Ref. Address:     [facility]                                                             [address]                                                             [place], [place]                                                             [address]  Attending:        Jaynie Collins         [facility]         [facility]  Referred By:      [facility] for                    [facility]                    Healthcare ---------------------------------------------------------------------- Orders   #  Description                                 Code   1  US FETAL BPP W/NONSTRESS                    01027.2  ----------------------------------------------------------------------   #  Ordered By               Order #        Accession #    Episode #   1  Jaynie Collins           [id number]      [id number]     [id number]  ---------------------------------------------------------------------- Indications   [redacted] weeks gestation of pregnancy                Z3A.32   Gestational diabetes in pregnancy,             O24.415   controlled by oral hypoglycemic drugs  ---------------------------------------------------------------------- OB History  Gravidity:    3         Term:   2  Living:       2 ---------------------------------------------------------------------- Fetal Evaluation  Num Of Fetuses:     1  Preg. Location:     Intrauterine  Cardiac Activity:   Observed  Fetal Lie:           Maternal left side  Presentation:       Cephalic  Amniotic Fluid  AFI FV:      Subjectively upper-normal  AFI Sum(cm)     %Tile       Largest Pocket(cm)  22.4            86          7.84  RUQ(cm)       RLQ(cm)       LUQ(cm)        LLQ(cm)  5.38          4.79          4.39           7.84  Comment:    Ignore marker on ultrasound imaging. ---------------------------------------------------------------------- Biophysical Evaluation  Amniotic F.V:   Pocket => 2 cm two         F. Tone:        Observed                  planes  F. Movement:    Observed                   N.S.T:  Reactive  F. Breathing:   Observed                   Score:          10/10 ---------------------------------------------------------------------- Gestational Age  LMP:           32w 6d        Date:  03/13/17                 EDD:   12/18/17  Best:          Armida Sans 6d     Det. By:  LMP  (03/13/17)          EDD:   12/18/17 ---------------------------------------------------------------------- Impression  IUP at  [redacted]w[redacted]d  Normal amniotic fluid volume  BPP 10/10 ---------------------------------------------------------------------- Recommendations  Reassuring  fetal testing  Continue recommended antenatal testing and delivery plan ----------------------------------------------------------------------               Jaynie Collins, MD Electronically Signed Final Report   11/04/2017 03:37 pm ----------------------------------------------------------------------  11/26/17  NST performed today was reviewed and was found to be reactive. Subsequent BPP performed today was also reviewed and was found to be 10/10. AFI was also normal. Continue recommended antenatal testing and prenatal care.   Assessment and Plan:  Pregnancy: G3P2002 at [redacted]w[redacted]d  1. Gestational diabetes mellitus, class A2 On review of BS, fastings (6/7) were in 100s, normal PP. On Metformin 1000 mg po bid. Counseled about need for NPH insulin at night, this is gold standard. Patient  declined this.  Discussed possible alternative of Glyburide, warned about hypoglycemia risks for mother and fetus, she wants to try small doses of this given that she is ~37 weeks, wants to avoid injection if she can. Glyburide prescribed at lowest dose, will titrate up as needed. - glyBURIDE (DIABETA) 2.5 MG tablet; Take 0.5 tablets (1.25 mg total) by mouth at bedtime.  Dispense: 30 tablet; Refill: 3 - ondansetron (ZOFRAN-ODT) 8 MG disintegrating tablet; Take 1 tablet (8 mg total) by mouth every 8 (eight) hours as needed for nausea or vomiting.  Dispense: 20 tablet; Refill: 1  2. Supervision of high risk pregnancy, antepartum, third trimester Preterm labor symptoms and general obstetric precautions including but not limited to vaginal bleeding, contractions, leaking of fluid and fetal movement were reviewed in detail with the patient. Please refer to After Visit Summary for other counseling recommendations.  Return in about 1 week (around 12/03/2017) for as scheduled.  Future Appointments  Date Time Provider Department Center  12/03/2017  8:15 AM [facility] NST [facility]  12/03/2017 10:15 AM Allie Bossier, MD [facility]  12/10/2017  2:15 PM [facility] NST [facility]  12/10/2017  3:15 PM Reva Bores, MD [facility]OC    Jaynie Collins, MD

## 2017-11-26 NOTE — Progress Notes (Signed)

## 2017-11-26 NOTE — Progress Notes (Signed)
Pt requests refill of Zofran

## 2017-11-26 NOTE — Patient Instructions (Signed)
Return to clinic for any scheduled appointments or obstetric concerns, or go to MAU for evaluation  

## 2017-12-03 ENCOUNTER — Encounter: Payer: Self-pay | Admitting: Obstetrics & Gynecology

## 2017-12-03 ENCOUNTER — Ambulatory Visit (INDEPENDENT_AMBULATORY_CARE_PROVIDER_SITE_OTHER): Payer: Medicaid Other | Admitting: Obstetrics & Gynecology

## 2017-12-03 ENCOUNTER — Ambulatory Visit (INDEPENDENT_AMBULATORY_CARE_PROVIDER_SITE_OTHER): Payer: Medicaid Other | Admitting: *Deleted

## 2017-12-03 ENCOUNTER — Ambulatory Visit: Payer: Self-pay

## 2017-12-03 ENCOUNTER — Telehealth (HOSPITAL_COMMUNITY): Payer: Self-pay | Admitting: *Deleted

## 2017-12-03 ENCOUNTER — Encounter: Payer: Medicaid Other | Admitting: Obstetrics and Gynecology

## 2017-12-03 VITALS — BP 131/66 | HR 104 | Wt 239.0 lb

## 2017-12-03 DIAGNOSIS — O24419 Gestational diabetes mellitus in pregnancy, unspecified control: Secondary | ICD-10-CM

## 2017-12-03 DIAGNOSIS — O99212 Obesity complicating pregnancy, second trimester: Secondary | ICD-10-CM

## 2017-12-03 DIAGNOSIS — O0993 Supervision of high risk pregnancy, unspecified, third trimester: Secondary | ICD-10-CM

## 2017-12-03 LAB — POCT URINALYSIS DIP (DEVICE)
Bilirubin Urine: NEGATIVE
GLUCOSE, UA: 100 mg/dL — AB
HGB URINE DIPSTICK: NEGATIVE
Nitrite: NEGATIVE
PROTEIN: NEGATIVE mg/dL
SPECIFIC GRAVITY, URINE: 1.015 (ref 1.005–1.030)
UROBILINOGEN UA: 0.2 mg/dL (ref 0.0–1.0)
pH: 6 (ref 5.0–8.0)

## 2017-12-03 NOTE — Progress Notes (Signed)
   PRENATAL VISIT NOTE  Subjective:  Cathy Park is a 27 y.o. G3P2002 at 507w6d being seen today for ongoing prenatal care.  She is currently monitored for the following issues for this high-risk pregnancy and has Supervision of high risk pregnancy, antepartum, third trimester; Obesity affecting pregnancy in second trimester; Gestational diabetes mellitus, class A2; and BMI 40.0-44.9, adult (HCC) on their problem list.  Patient reports no complaints.  Contractions: Irregular. Vag. Bleeding: None.  Movement: Present. Denies leaking of fluid.   The following portions of the patient's history were reviewed and updated as appropriate: allergies, current medications, past family history, past medical history, past social history, past surgical history and problem list. Problem list updated.  Objective:   Vitals:   12/03/17 0846  BP: 131/66  Pulse: (!) 104  Weight: 239 lb (108.4 kg)    Fetal Status: Fetal Heart Rate (bpm): NST   Movement: Present     General:  Alert, oriented and cooperative. Patient is in no acute distress.  Skin: Skin is warm and dry. No rash noted.   Cardiovascular: Normal heart rate noted  Respiratory: Normal respiratory effort, no problems with respiration noted  Abdomen: Soft, gravid, appropriate for gestational age.  Pain/Pressure: Present     Pelvic: Cervical exam performed       1/th/high  Extremities: Normal range of motion.  Edema: None  Mental Status: Normal mood and affect. Normal behavior. Normal judgment and thought content.   Assessment and Plan:  Pregnancy: G3P2002 at 760w6d  1. Supervision of high risk pregnancy, antepartum, third trimester   2. Gestational diabetes mellitus, class A2 - currently on metformin and 1.25 of glyburide  - her fastings are still above 90, so I rec'd that she take 2.5 mg glyburide qhs -IOL on 12-11-17 - I offered outpatient foley bulb placement but she declined  3. Obesity affecting pregnancy in second  trimester   Preterm labor symptoms and general obstetric precautions including but not limited to vaginal bleeding, contractions, leaking of fluid and fetal movement were reviewed in detail with the patient. Please refer to After Visit Summary for other counseling recommendations.  Return in about 7 weeks (around 01/21/2018) for PP visit w/2hr GTT.  IOL on 6/14.  Future Appointments  Date Time Provider Department Center  12/03/2017 10:15 AM Allie Bossierove, Danne Scardina C, MD [facility]OC  12/10/2017  2:15 PM [facility] NST [facility] WOC  12/10/2017  3:15 PM Reva BoresPratt, Tanya S, MD [facility] WOC  12/11/2017 12:00 AM [facility] ROOM [facility] None    Allie BossierMyra C Marvette Schamp, MD

## 2017-12-03 NOTE — Progress Notes (Signed)
I have reviewed this chart and agree with the RN/CMA assessment and management.    Jesi Jurgens C Gabe Glace, MD, FACOG Attending Physician, [facility]  

## 2017-12-03 NOTE — Telephone Encounter (Signed)
Preadmission screen  

## 2017-12-03 NOTE — Progress Notes (Signed)
Pt just started taking glyburide 2 days ago. She reports increased frequency and intensity of UC's and requests Cx exam today. IOL scheduled 6/14 @ midnight.

## 2017-12-03 NOTE — Progress Notes (Signed)

## 2017-12-07 NOTE — Addendum Note (Signed)
Addended by: Allie BossierVE, Hrithik Boschee C on: 12/07/2017 08:50 AM   Modules accepted: Orders, SmartSet

## 2017-12-08 ENCOUNTER — Encounter (HOSPITAL_COMMUNITY): Payer: Self-pay

## 2017-12-08 ENCOUNTER — Inpatient Hospital Stay (HOSPITAL_COMMUNITY)
Admission: AD | Admit: 2017-12-08 | Discharge: 2017-12-08 | Disposition: A | Discharge status: Home / Self Care | Payer: Medicaid Other | Source: Ambulatory Visit | Attending: Obstetrics and Gynecology | Admitting: Obstetrics and Gynecology

## 2017-12-08 ENCOUNTER — Telehealth: Payer: Self-pay | Admitting: General Practice

## 2017-12-08 DIAGNOSIS — Z3A38 38 weeks gestation of pregnancy: Secondary | ICD-10-CM | POA: Insufficient documentation

## 2017-12-08 DIAGNOSIS — O26893 Other specified pregnancy related conditions, third trimester: Secondary | ICD-10-CM | POA: Diagnosis not present

## 2017-12-08 DIAGNOSIS — Z0371 Encounter for suspected problem with amniotic cavity and membrane ruled out: Secondary | ICD-10-CM

## 2017-12-08 HISTORY — DX: Gestational diabetes mellitus in pregnancy, unspecified control: O24.419

## 2017-12-08 LAB — POCT FERN TEST: POCT Fern Test: NEGATIVE

## 2017-12-08 NOTE — MAU Provider Note (Signed)
None      S: Ms. Cathy Park is Park 27 y.o. G3P2002 at 6055w4d  who presents to MAU today complaining of leaking of fluid x 1 episode this afternoon. There is no additional leakage, she is not wearing Park pad. She denies vaginal bleeding. She denies contractions. She reports normal fetal movement.    O: BP 126/84 (BP Location: Right Arm)   Pulse (!) 106   Temp 98.2 F (36.8 C) (Oral)   Resp 20   Ht 5\' 2"  (1.575 m)   Wt 244 lb (110.7 kg)   LMP 03/13/2017 (Exact Date)   BMI 44.63 kg/m  GENERAL: Well-developed, well-nourished female in no acute distress.  HEAD: Normocephalic, atraumatic.  CHEST: Normal effort of breathing, regular heart rate ABDOMEN: Soft, nontender, gravid PELVIC: Normal external female genitalia. Vagina is pink and rugated. Cervix with normal contour, no lesions. Normal discharge.  negative pooling.   Ferning negative  Cervical exam:   Visually 1 cm   Fetal Monitoring: Baseline: 150 Variability: moderate Accelerations: present Decelerations: none Contractions: irregular, mild to palpation  Results for orders placed or performed during the hospital encounter of 12/08/17 (from the past 24 hour(s))  Fern Test     Status: Normal   Collection Time: 12/08/17  5:23 PM  Result Value Ref Range   POCT Fern Test Negative = intact amniotic membranes      Park: SIUP at 955w4d  Membranes intact  P: [facility] and for IOL as scheduled, return to MAU for signs of labor or emergencies.  Hurshel PartyLeftwich-Kirby, Cathy Park, CNM 12/08/2017 5:40 PM

## 2017-12-08 NOTE — Telephone Encounter (Signed)
Patient called and left message on nurse voicemail line stating she is unsure if her water broke. If it did, it would've been last night at 9:45pm. Patient reports no contractions only increased pressure. Patient is requesting a call back.  Called patient and she states she was using the bathroom last night and heard a pop then a good amount of fluid came out. Patient reports only a few contractions since then and some increase in pressure. Patient reports continued discharge like what she has been having but no watery discharge. Recommended she go to MAU for evaluation. Patient verbalized understanding & had no questions

## 2017-12-08 NOTE — Discharge Instructions (Signed)

## 2017-12-08 NOTE — MAU Note (Signed)
Pt heard a pop & felt ? Gush of fluid @ 2145 last night.  Has had irregular uc's today.  Has had some clear discharge today, no bleeding.  Reports good fetal movement.

## 2017-12-10 ENCOUNTER — Encounter: Payer: Medicaid Other | Admitting: Obstetrics and Gynecology

## 2017-12-10 ENCOUNTER — Ambulatory Visit: Payer: Medicaid Other | Admitting: *Deleted

## 2017-12-10 ENCOUNTER — Ambulatory Visit (INDEPENDENT_AMBULATORY_CARE_PROVIDER_SITE_OTHER): Payer: Medicaid Other | Admitting: Family Medicine

## 2017-12-10 VITALS — BP 130/76 | HR 106 | Wt 241.9 lb

## 2017-12-10 DIAGNOSIS — O0993 Supervision of high risk pregnancy, unspecified, third trimester: Secondary | ICD-10-CM | POA: Diagnosis present

## 2017-12-10 DIAGNOSIS — O24419 Gestational diabetes mellitus in pregnancy, unspecified control: Secondary | ICD-10-CM

## 2017-12-10 NOTE — Patient Instructions (Signed)

## 2017-12-10 NOTE — Progress Notes (Signed)
    PRENATAL VISIT NOTE  Subjective:  Cathy Park is a 27 y.o. G3P2002 at 5264w6d being seen today for ongoing prenatal care.  She is currently monitored for the following issues for this high-risk pregnancy and has Supervision of high risk pregnancy, antepartum, third trimester; Obesity affecting pregnancy in second trimester; Gestational diabetes mellitus, class A2; and BMI 40.0-44.9, adult (HCC) on their problem list.  Patient reports no complaints.  Contractions: Irregular. Vag. Bleeding: None.  Movement: Present. Denies leaking of fluid.   The following portions of the patient's history were reviewed and updated as appropriate: allergies, current medications, past family history, past medical history, past social history, past surgical history and problem list. Problem list updated.  Objective:   Vitals:   12/10/17 1452  BP: 130/76  Pulse: (!) 106  Weight: 241 lb 14.4 oz (109.7 kg)    Fetal Status: Fetal Heart Rate (bpm): NST   Movement: Present     General:  Alert, oriented and cooperative. Patient is in no acute distress.  Skin: Skin is warm and dry. No rash noted.   Cardiovascular: Normal heart rate noted  Respiratory: Normal respiratory effort, no problems with respiration noted  Abdomen: Soft, gravid, appropriate for gestational age.  Pain/Pressure: Present     Pelvic: Cervical exam deferred        Extremities: Normal range of motion.     Mental Status:  Normal mood and affect. Normal behavior. Normal judgment and thought content.  Procedure: Patient informed of R/B/A of procedure. NST was performed and was reactive prior to procedure. NST:  EFM: Baseline: 150 bpm, Variability: Good {> 6 bpm), Accelerations: Reactive and Decelerations: Absent Toco: regular, every 5 minutes Procedure done to begin ripening of the cervix prior to admission for induction of labor. Appropriate time out taken. The patient was placed in the lithotomy position and the cervix brought into view  with sterile speculum. A ring forcep was used to guide the 34F foley balloon through the internal os of the cervix. Foley Balloon filled with 50cc of normal saline. Plug inserted into end of the foley. Foley placed on tension and taped to medical thigh.  NST:  EFM Baseline: 150 bpm, Variability: Good {> 6 bpm), Accelerations: Reactive and Decelerations: Absent  Toco: regular, every 5 minutes There were no signs of tachysystole or hypertonus. All equipment was removed and accounted for. The patient tolerated the procedure well.  Assessment and Plan:  Pregnancy: G3P2002 at 8464w6d 1. Supervision of high risk pregnancy, antepartum, third trimester   2. Gestational diabetes mellitus, class A2 Continue metformin and glyburide IOL at midnight with foley placed in office   S/p Outpatient placement of foley balloon catheter for cervical ripening. Induction of labor scheduled for tomorrow at 1200 am. Reassuring FHR tracing with no concerns at present. Warning signs given to patient to include return to MAU for heavy vaginal bleeding, Rupture of membranes, painful uterine contractions q 5 mins or less, severe abdominal discomfort, decreased fetal movement.  Return in 1 month (on 01/07/2018). for pp check   Reva Boresanya S Pratt, MD 12/10/2017 3:34 PM

## 2017-12-10 NOTE — Progress Notes (Signed)
Pt was seen @ MAU on 6/11 for possible SROM - negative. She is scheduled for IOL @ midnight tonight and desires foley bulb.

## 2017-12-11 ENCOUNTER — Inpatient Hospital Stay (HOSPITAL_COMMUNITY)
Admission: RE | Admit: 2017-12-11 | Discharge: 2017-12-13 | DRG: 798 | Disposition: A | Discharge status: Home / Self Care | Payer: Medicaid Other | Attending: Obstetrics & Gynecology | Admitting: Obstetrics & Gynecology

## 2017-12-11 ENCOUNTER — Encounter (HOSPITAL_COMMUNITY): Payer: Self-pay

## 2017-12-11 ENCOUNTER — Encounter (HOSPITAL_COMMUNITY)
Admission: RE | Disposition: A | Discharge status: Home / Self Care | Payer: Self-pay | Source: Home / Self Care | Attending: Obstetrics & Gynecology

## 2017-12-11 ENCOUNTER — Inpatient Hospital Stay (HOSPITAL_COMMUNITY): Payer: Medicaid Other | Admitting: Anesthesiology

## 2017-12-11 DIAGNOSIS — O24425 Gestational diabetes mellitus in childbirth, controlled by oral hypoglycemic drugs: Secondary | ICD-10-CM | POA: Diagnosis present

## 2017-12-11 DIAGNOSIS — Z3A39 39 weeks gestation of pregnancy: Secondary | ICD-10-CM

## 2017-12-11 DIAGNOSIS — O99214 Obesity complicating childbirth: Secondary | ICD-10-CM | POA: Diagnosis present

## 2017-12-11 DIAGNOSIS — O24419 Gestational diabetes mellitus in pregnancy, unspecified control: Secondary | ICD-10-CM | POA: Diagnosis present

## 2017-12-11 DIAGNOSIS — O0993 Supervision of high risk pregnancy, unspecified, third trimester: Secondary | ICD-10-CM

## 2017-12-11 DIAGNOSIS — Z302 Encounter for sterilization: Secondary | ICD-10-CM

## 2017-12-11 HISTORY — PX: TUBAL LIGATION: SHX77

## 2017-12-11 LAB — TYPE AND SCREEN
ABO/RH(D): A POS
ANTIBODY SCREEN: NEGATIVE

## 2017-12-11 LAB — CBC
HEMATOCRIT: 37 % (ref 36.0–46.0)
HEMOGLOBIN: 11.9 g/dL — AB (ref 12.0–15.0)
MCH: 25.1 pg — ABNORMAL LOW (ref 26.0–34.0)
MCHC: 32.2 g/dL (ref 30.0–36.0)
MCV: 77.9 fL — AB (ref 78.0–100.0)
Platelets: 210 10*3/uL (ref 150–400)
RBC: 4.75 MIL/uL (ref 3.87–5.11)
RDW: 17 % — AB (ref 11.5–15.5)
WBC: 14.2 10*3/uL — ABNORMAL HIGH (ref 4.0–10.5)

## 2017-12-11 LAB — GLUCOSE, CAPILLARY
GLUCOSE-CAPILLARY: 109 mg/dL — AB (ref 65–99)
Glucose-Capillary: 93 mg/dL (ref 65–99)
Glucose-Capillary: 74 mg/dL (ref 65–99)

## 2017-12-11 LAB — ABO/RH: ABO/RH(D): A POS

## 2017-12-11 SURGERY — LIGATION, FALLOPIAN TUBE, POSTPARTUM
Anesthesia: Epidural

## 2017-12-11 MED ORDER — LACTATED RINGERS IV SOLN: 500.0000 mL | Freq: Once | INTRAVENOUS | Status: DC

## 2017-12-11 MED ORDER — ONDANSETRON HCL 4 MG/2ML IJ SOLN
INTRAMUSCULAR | Status: DC | PRN
  Administered 2017-12-11: 4 mg via INTRAVENOUS

## 2017-12-11 MED ORDER — OXYCODONE HCL 5 MG/5ML PO SOLN: 5.0000 mg | Freq: Once | ORAL | Status: DC | PRN

## 2017-12-11 MED ORDER — ONDANSETRON HCL 4 MG/2ML IJ SOLN: 4.0000 mg | INTRAMUSCULAR | Status: DC | PRN

## 2017-12-11 MED ORDER — WITCH HAZEL-GLYCERIN EX PADS
1.0000 "application " | MEDICATED_PAD | CUTANEOUS | Status: DC | PRN
  Administered 2017-12-12: 1 via TOPICAL

## 2017-12-11 MED ORDER — FENTANYL 2.5 MCG/ML BUPIVACAINE 1/10 % EPIDURAL INFUSION (WH - ANES)
14.0000 mL/h | INTRAMUSCULAR | Status: DC | PRN
  Administered 2017-12-11: 14 mL/h via EPIDURAL
  Filled 2017-12-11: qty 100

## 2017-12-11 MED ORDER — OXYTOCIN 40 UNITS IN LACTATED RINGERS INFUSION - SIMPLE MED
1.0000 m[IU]/min | INTRAVENOUS | Status: DC
  Administered 2017-12-11: 2 m[IU]/min via INTRAVENOUS

## 2017-12-11 MED ORDER — LACTATED RINGERS IV SOLN
500.0000 mL | INTRAVENOUS | Status: DC | PRN
  Administered 2017-12-11: 300 mL via INTRAVENOUS

## 2017-12-11 MED ORDER — FENTANYL CITRATE (PF) 100 MCG/2ML IJ SOLN
100.0000 ug | INTRAMUSCULAR | Status: DC | PRN
  Filled 2017-12-11: qty 2

## 2017-12-11 MED ORDER — SOD CITRATE-CITRIC ACID 500-334 MG/5ML PO SOLN
30.0000 mL | ORAL | Status: DC | PRN
  Administered 2017-12-11: 30 mL via ORAL
  Filled 2017-12-11: qty 15

## 2017-12-11 MED ORDER — EPHEDRINE 5 MG/ML INJ: 10.0000 mg | INTRAVENOUS | Status: DC | PRN

## 2017-12-11 MED ORDER — PHENYLEPHRINE 40 MCG/ML (10ML) SYRINGE FOR IV PUSH (FOR BLOOD PRESSURE SUPPORT)
80.0000 ug | PREFILLED_SYRINGE | INTRAVENOUS | Status: DC | PRN
  Administered 2017-12-11: 80 ug via INTRAVENOUS
  Filled 2017-12-11: qty 10

## 2017-12-11 MED ORDER — OXYCODONE-ACETAMINOPHEN 5-325 MG PO TABS: 1.0000 | ORAL_TABLET | ORAL | Status: DC | PRN

## 2017-12-11 MED ORDER — MEASLES, MUMPS & RUBELLA VAC ~~LOC~~ INJ
0.5000 mL | INJECTION | Freq: Once | SUBCUTANEOUS | Status: DC
  Filled 2017-12-11: qty 0.5

## 2017-12-11 MED ORDER — BUPIVACAINE HCL (PF) 0.25 % IJ SOLN
INTRAMUSCULAR | Status: AC
  Filled 2017-12-11: qty 30

## 2017-12-11 MED ORDER — FENTANYL CITRATE (PF) 100 MCG/2ML IJ SOLN
INTRAMUSCULAR | Status: AC
  Filled 2017-12-11: qty 2

## 2017-12-11 MED ORDER — OXYTOCIN 40 UNITS IN LACTATED RINGERS INFUSION - SIMPLE MED
2.5000 [IU]/h | INTRAVENOUS | Status: DC
  Administered 2017-12-11: 2.5 [IU]/h via INTRAVENOUS
  Filled 2017-12-11: qty 1000

## 2017-12-11 MED ORDER — LACTATED RINGERS IV SOLN
INTRAVENOUS | Status: DC | PRN
  Administered 2017-12-11 (×2): via INTRAVENOUS

## 2017-12-11 MED ORDER — LIDOCAINE HCL (PF) 1 % IJ SOLN
INTRAMUSCULAR | Status: DC | PRN
  Administered 2017-12-11: 5 mL via EPIDURAL

## 2017-12-11 MED ORDER — FERROUS SULFATE 325 (65 FE) MG PO TABS
325.0000 mg | ORAL_TABLET | Freq: Two times a day (BID) | ORAL | Status: DC
  Administered 2017-12-12 – 2017-12-13 (×3): 325 mg via ORAL
  Filled 2017-12-11 (×3): qty 1

## 2017-12-11 MED ORDER — PRENATAL MULTIVITAMIN CH
1.0000 | ORAL_TABLET | Freq: Every day | ORAL | Status: DC
  Administered 2017-12-12 – 2017-12-13 (×2): 1 via ORAL
  Filled 2017-12-11 (×2): qty 1

## 2017-12-11 MED ORDER — ONDANSETRON HCL 4 MG/2ML IJ SOLN
INTRAMUSCULAR | Status: AC
  Filled 2017-12-11: qty 2

## 2017-12-11 MED ORDER — SIMETHICONE 80 MG PO CHEW
80.0000 mg | CHEWABLE_TABLET | ORAL | Status: DC | PRN
Start: 2017-12-11 — End: 2017-12-13
  Administered 2017-12-12: 80 mg via ORAL
  Filled 2017-12-11: qty 1

## 2017-12-11 MED ORDER — FAMOTIDINE IN NACL 20-0.9 MG/50ML-% IV SOLN
20.0000 mg | Freq: Once | INTRAVENOUS | Status: AC
  Administered 2017-12-11: 20 mg via INTRAVENOUS
  Filled 2017-12-11: qty 50

## 2017-12-11 MED ORDER — ONDANSETRON HCL 4 MG PO TABS
4.0000 mg | ORAL_TABLET | ORAL | Status: DC | PRN
  Administered 2017-12-12 (×2): 4 mg via ORAL
  Filled 2017-12-11 (×2): qty 1

## 2017-12-11 MED ORDER — PHENYLEPHRINE 40 MCG/ML (10ML) SYRINGE FOR IV PUSH (FOR BLOOD PRESSURE SUPPORT): 80.0000 ug | PREFILLED_SYRINGE | INTRAVENOUS | Status: DC | PRN

## 2017-12-11 MED ORDER — MIDAZOLAM HCL 5 MG/5ML IJ SOLN
INTRAMUSCULAR | Status: DC | PRN
  Administered 2017-12-11: 2 mg via INTRAVENOUS

## 2017-12-11 MED ORDER — DIPHENHYDRAMINE HCL 25 MG PO CAPS: 25.0000 mg | ORAL_CAPSULE | Freq: Four times a day (QID) | ORAL | Status: DC | PRN

## 2017-12-11 MED ORDER — SODIUM BICARBONATE 8.4 % IV SOLN
INTRAVENOUS | Status: DC | PRN
  Administered 2017-12-11: 5 mL via EPIDURAL
  Administered 2017-12-11: 10 mL via EPIDURAL

## 2017-12-11 MED ORDER — LIDOCAINE HCL (PF) 1 % IJ SOLN: 30.0000 mL | INTRAMUSCULAR | Status: DC | PRN

## 2017-12-11 MED ORDER — OXYCODONE-ACETAMINOPHEN 5-325 MG PO TABS: 2.0000 | ORAL_TABLET | ORAL | Status: DC | PRN

## 2017-12-11 MED ORDER — IBUPROFEN 600 MG PO TABS
600.0000 mg | ORAL_TABLET | Freq: Four times a day (QID) | ORAL | Status: DC
  Administered 2017-12-11 – 2017-12-13 (×8): 600 mg via ORAL
  Filled 2017-12-11 (×8): qty 1

## 2017-12-11 MED ORDER — PHENYLEPHRINE 40 MCG/ML (10ML) SYRINGE FOR IV PUSH (FOR BLOOD PRESSURE SUPPORT)
80.0000 ug | PREFILLED_SYRINGE | INTRAVENOUS | Status: DC | PRN
  Filled 2017-12-11: qty 10

## 2017-12-11 MED ORDER — ACETAMINOPHEN 325 MG PO TABS
650.0000 mg | ORAL_TABLET | ORAL | Status: DC | PRN
Start: 2017-12-11 — End: 2017-12-13
  Administered 2017-12-11 – 2017-12-12 (×2): 650 mg via ORAL
  Filled 2017-12-11 (×3): qty 2

## 2017-12-11 MED ORDER — BUPIVACAINE HCL (PF) 0.25 % IJ SOLN
INTRAMUSCULAR | Status: DC | PRN
  Administered 2017-12-11: 10 mL

## 2017-12-11 MED ORDER — DIBUCAINE 1 % RE OINT: 1.0000 "application " | TOPICAL_OINTMENT | RECTAL | Status: DC | PRN

## 2017-12-11 MED ORDER — LIDOCAINE-EPINEPHRINE (PF) 2 %-1:200000 IJ SOLN
INTRAMUSCULAR | Status: AC
  Filled 2017-12-11: qty 20

## 2017-12-11 MED ORDER — LACTATED RINGERS IV SOLN
INTRAVENOUS | Status: DC
  Administered 2017-12-11 (×2): via INTRAVENOUS

## 2017-12-11 MED ORDER — OXYCODONE HCL 5 MG PO TABS: 5.0000 mg | ORAL_TABLET | Freq: Once | ORAL | Status: DC | PRN

## 2017-12-11 MED ORDER — MIDAZOLAM HCL 2 MG/2ML IJ SOLN
INTRAMUSCULAR | Status: AC
  Filled 2017-12-11: qty 2

## 2017-12-11 MED ORDER — PROMETHAZINE HCL 25 MG/ML IJ SOLN: 6.2500 mg | INTRAMUSCULAR | Status: DC | PRN

## 2017-12-11 MED ORDER — BENZOCAINE-MENTHOL 20-0.5 % EX AERO
1.0000 "application " | INHALATION_SPRAY | CUTANEOUS | Status: DC | PRN
  Administered 2017-12-11: 1 via TOPICAL
  Filled 2017-12-11: qty 56

## 2017-12-11 MED ORDER — ONDANSETRON HCL 4 MG/2ML IJ SOLN: 4.0000 mg | Freq: Four times a day (QID) | INTRAMUSCULAR | Status: DC | PRN

## 2017-12-11 MED ORDER — FENTANYL CITRATE (PF) 100 MCG/2ML IJ SOLN: 25.0000 ug | INTRAMUSCULAR | Status: DC | PRN

## 2017-12-11 MED ORDER — OXYCODONE-ACETAMINOPHEN 5-325 MG PO TABS
1.0000 | ORAL_TABLET | ORAL | Status: DC | PRN
  Administered 2017-12-12 – 2017-12-13 (×4): 1 via ORAL
  Filled 2017-12-11 (×4): qty 1

## 2017-12-11 MED ORDER — BUPIVACAINE HCL (PF) 0.25 % IJ SOLN
INTRAMUSCULAR | Status: AC
  Filled 2017-12-11: qty 10

## 2017-12-11 MED ORDER — SODIUM BICARBONATE 8.4 % IV SOLN
INTRAVENOUS | Status: AC
  Filled 2017-12-11: qty 50

## 2017-12-11 MED ORDER — COCONUT OIL OIL: 1.0000 "application " | TOPICAL_OIL | Status: DC | PRN

## 2017-12-11 MED ORDER — DIPHENHYDRAMINE HCL 50 MG/ML IJ SOLN
12.5000 mg | INTRAMUSCULAR | Status: DC | PRN
  Administered 2017-12-11 (×2): 12.5 mg via INTRAVENOUS
  Filled 2017-12-11: qty 1

## 2017-12-11 MED ORDER — OXYTOCIN BOLUS FROM INFUSION
500.0000 mL | Freq: Once | INTRAVENOUS | Status: AC
  Administered 2017-12-11: 500 mL via INTRAVENOUS

## 2017-12-11 MED ORDER — MAGNESIUM HYDROXIDE 400 MG/5ML PO SUSP
30.0000 mL | ORAL | Status: DC | PRN
  Administered 2017-12-13: 30 mL via ORAL
  Filled 2017-12-11: qty 30

## 2017-12-11 MED ORDER — TETANUS-DIPHTH-ACELL PERTUSSIS 5-2.5-18.5 LF-MCG/0.5 IM SUSP: 0.5000 mL | Freq: Once | INTRAMUSCULAR | Status: DC

## 2017-12-11 MED ORDER — FENTANYL CITRATE (PF) 100 MCG/2ML IJ SOLN
INTRAMUSCULAR | Status: DC | PRN
  Administered 2017-12-11: 100 ug via INTRAVENOUS

## 2017-12-11 MED ORDER — ACETAMINOPHEN 325 MG PO TABS: 650.0000 mg | ORAL_TABLET | ORAL | Status: DC | PRN

## 2017-12-11 MED ORDER — TERBUTALINE SULFATE 1 MG/ML IJ SOLN: 0.2500 mg | Freq: Once | INTRAMUSCULAR | Status: DC | PRN

## 2017-12-11 SURGICAL SUPPLY — 22 items
BLADE SURG 11 STRL SS (BLADE) ×2 IMPLANT
CLIP FILSHIE TUBAL LIGA STRL (Clip) ×2 IMPLANT
CLOTH BEACON ORANGE TIMEOUT ST (SAFETY) ×2 IMPLANT
DRESSING OPSITE X SMALL 2X3 (GAUZE/BANDAGES/DRESSINGS) ×2 IMPLANT
DRSG OPSITE POSTOP 3X4 (GAUZE/BANDAGES/DRESSINGS) ×2 IMPLANT
DURAPREP 26ML APPLICATOR (WOUND CARE) ×2 IMPLANT
GLOVE BIO SURGEON STRL SZ 6.5 (GLOVE) ×2 IMPLANT
GLOVE BIOGEL PI IND STRL 7.0 (GLOVE) ×2 IMPLANT
GLOVE BIOGEL PI INDICATOR 7.0 (GLOVE) ×2
GOWN STRL REUS W/TWL LRG LVL3 (GOWN DISPOSABLE) ×4 IMPLANT
NEEDLE HYPO 22GX1.5 SAFETY (NEEDLE) ×2 IMPLANT
NS IRRIG 1000ML POUR BTL (IV SOLUTION) ×2 IMPLANT
PACK ABDOMINAL MINOR (CUSTOM PROCEDURE TRAY) ×2 IMPLANT
PROTECTOR NERVE ULNAR (MISCELLANEOUS) ×2 IMPLANT
SPONGE LAP 4X18 X RAY DECT (DISPOSABLE) IMPLANT
SUT VIC AB 0 CT1 27 (SUTURE) ×1
SUT VIC AB 0 CT1 27XBRD ANBCTR (SUTURE) ×1 IMPLANT
SUT VICRYL 4-0 PS2 18IN ABS (SUTURE) ×2 IMPLANT
SYR CONTROL 10ML LL (SYRINGE) ×2 IMPLANT
TOWEL OR 17X24 6PK STRL BLUE (TOWEL DISPOSABLE) ×4 IMPLANT
TRAY FOLEY CATH SILVER 16FR (SET/KITS/TRAYS/PACK) ×2 IMPLANT
WATER STERILE IRR 1000ML POUR (IV SOLUTION) ×2 IMPLANT

## 2017-12-11 NOTE — H&P (Addendum)
LABOR AND DELIVERY ADMISSION HISTORY AND PHYSICAL NOTE  Anyely Cunning is a 27 y.o. female G33P2002 with IUP at [redacted]w[redacted]d by LMP presenting for IOL for A2gDM.  She reports positive fetal movement. She denies leakage of fluid or vaginal bleeding. Had FB placed in clinic today.  Prenatal History/Complications: PNC at [facility] Pregnancy complications:  - A2GDM, on metformin and glyburide - obesity  Past Medical History: Past Medical History:  Diagnosis Date  . Asthma   . Gestational diabetes   . Supervision of high risk pregnancy, antepartum, third trimester 08/26/2017     Clinic  [facility] Prenatal Labs Dating LMP Blood type: A/Positive/-- (11/15 0000)  Genetic Screen 1 Screen:    AFP:     Quad:     NIPS: normal Antibody: neg Anatomic [place]  normal but needs fup [place] in 6 weeks for non-visualized anatomy  Rubella: Immune (11/15 0000) GTT Early:               Third trimester: 99/183/118 RPR: Nonreactive (11/15 0000)  Flu vaccine  Flu 2-27/2019 HBsAg: Negative (11/15 0000    Past Surgical History: Past Surgical History:  Procedure Laterality Date  . APPENDECTOMY    . DENTAL SURGERY     drained infection    Obstetrical History: OB History    Gravida  3   Para  2   Term  2   Preterm  0   AB  0   Living  2     SAB  0   TAB  0   Ectopic  0   Multiple  0   Live Births  2        Obstetric Comments  Induction at 39wks on 1st pregnancy d/t LGA.  2nd pregnancy was elective induction        Social History: Social History   Socioeconomic History  . Marital status: Married    Spouse name: Not on file  . Number of children: Not on file  . Years of education: Not on file  . Highest education level: Not on file  Occupational History  . Not on file  Social Needs  . Financial resource strain: Not on file  . Food insecurity:    Worry: Not on file    Inability: Not on file  . Transportation needs:    Medical: Not on file    Non-medical: Not on file  Tobacco Use  .  Smoking status: Never Smoker  . Smokeless tobacco: Never Used  Substance and Sexual Activity  . Alcohol use: No    Frequency: Never  . Drug use: No  . Sexual activity: Yes    Birth control/protection: None  Lifestyle  . Physical activity:    Days per week: Not on file    Minutes per session: Not on file  . Stress: Not on file  Relationships  . Social connections:    Talks on phone: Not on file    Gets together: Not on file    Attends religious service: Not on file    Active member of club or organization: Not on file    Attends meetings of clubs or organizations: Not on file    Relationship status: Not on file  Other Topics Concern  . Not on file  Social History Narrative  . Not on file    Family History: Family History  Problem Relation Age of Onset  . Diabetes Mother   . Diabetes Brother   . Cancer Paternal Aunt   .  Diabetes Maternal Grandmother   . Diabetes Maternal Grandfather   . Cancer Paternal Grandmother   . Cancer Paternal Grandfather     Allergies: No Known Allergies   (Not in a hospital admission)   Review of Systems  All systems reviewed and negative except as stated in HPI  Physical Exam Blood pressure 124/77, pulse (!) 105, temperature 98.4 F (36.9 C), temperature source Oral, resp. rate 18, height 5\' 2"  (1.575 m), weight 244 lb 7 oz (110.9 kg), last menstrual period 03/13/2017. General appearance: alert, oriented, NAD Lungs: normal respiratory effort Heart: regular rate Abdomen: soft, non-tender; gravid, FH appropriate for GA Extremities: No calf swelling or tenderness Presentation: cephalic Fetal monitoring: 140 bpm/moderate variability/+accel, no decel Uterine activity: q3 mins  SVE: FB still in place    Prenatal labs: ABO, Rh: A/Positive/-- (11/15 0000) Antibody: Negative (02/27 1420) Rubella: Immune (11/15 0000) RPR: Non Reactive (04/01 0828)  HBsAg: Negative (11/15 0000)  HIV: Non Reactive (04/01 0828)  GC/Chlamydia:  negative GBS: Negative (05/23 1603)  2-hr GTT: positive: 99/193/118 Genetic screening:  negative Anatomy US: normal  Prenatal Transfer Tool  Maternal Diabetes: Yes:  Diabetes Type:  Insulin/Medication controlled Genetic Screening: Normal Maternal Ultrasounds/Referrals: Normal Fetal Ultrasounds or other Referrals:  None Maternal Substance Abuse:  No Significant Maternal Medications:  Meds include: Other: metformin Significant Maternal Lab Results: Lab values include: Other: positive gDM screen  No results found for this or any previous visit (from the past 24 hour(s)).  Patient Active Problem List   Diagnosis Date Noted  . BMI 40.0-44.9, adult (HCC) 11/19/2017  . Gestational diabetes mellitus, class A2 10/01/2017  . Obesity affecting pregnancy in second trimester   . Supervision of high risk pregnancy, antepartum, third trimester 08/26/2017    Assessment: Bobbye RiggsLauren Detlefsen is a 27 y.o. G3P2002 at 575w0d here for IOL for A2GDM.  #Induction of Labor: FB placed in clinic today. FB remains in place. Plan for pitocin and AROM when appropriate.  #A2GDM: CBGs q4h.  #Pain: Per patient request #FWB: Cat 1 FHT. Last US 11/19/17 at 35 weeks: EFW 3147g (6#15), AC 95%, HC 94%, AFI 18.29 #ID:  GBS negative #MOF: breast #MOC: BTL - consent signed 09/23/17 #Circ:  n/a  GrenadaBrittany P Hipkins 12/11/2017, 12:19 AM  OB FELLOW HISTORY AND PHYSICAL ATTESTATION I have seen and examined this patient; I agree with above documentation in the resident's note.   27 y.o. [id number] with IUP at 4475w0d here for IOL for A2GDM. FB in place and pt contracting regularly. Expectant management for now. FHT Cat I GBS neg IV pain med prn. Planning on epidural in active labor GDM: monitor CBGs q4h for now, q2h in active labor  Frederik PearJulie P Degele, MD OB Fellow 12/11/2017, 2:11 AM

## 2017-12-11 NOTE — Anesthesia Pain Management Evaluation Note (Signed)
  CRNA Pain Management Visit Note  Patient: Cathy Park, 27 y.o., female  "Hello I am a [facility]. We have an anesthesia team available at all times to provide care throughout the hospital, including epidural management and anesthesia for C-section. I don't know your plan for the delivery whether it a natural birth, water birth, IV sedation, nitrous supplementation, doula or epidural, but we want to meet your pain goals."   1.Was your pain managed to your expectations on prior hospitalizations?   Yes   2.What is your expectation for pain management during this hospitalization?     Epidural  3.How can we help you reach that goal? epidural  Record the patient's initial score and the patient's pain goal.   Pain: 0  Pain Goal: 3 [facility] wants you to be able to say your pain was always managed very well.  Quaid Yeakle 12/11/2017

## 2017-12-11 NOTE — Transfer of Care (Signed)
Immediate Anesthesia Transfer of Care Note  Patient: Bobbye RiggsLauren Krawiec  Procedure(s) Performed: POST PARTUM TUBAL LIGATION (N/A )  Patient Location: PACU  Anesthesia Type:Epidural  Level of Consciousness: awake alert stable  Airway & Oxygen Therapy: Patient Spontanous Breathing  Post-op Assessment: Report given to RN  Post vital signs: Reviewed and stable  Last Vitals:  Vitals Value Taken Time  BP    Temp    Pulse 99 12/11/2017  3:04 PM  Resp    SpO2 95 % 12/11/2017  3:04 PM  Vitals shown include unvalidated device data.  Last Pain:  Vitals:   12/11/17 1415  TempSrc:   PainSc: 0-No pain      Patients Stated Pain Goal: 2 (12/11/17 0050)  Complications: No apparent anesthesia complications

## 2017-12-11 NOTE — Anesthesia Preprocedure Evaluation (Addendum)
Anesthesia Evaluation  Patient identified by MRN, date of birth, ID band Patient awake    Reviewed: Allergy & Precautions, NPO status , Patient's Chart, lab work & pertinent test results  Airway Mallampati: II  TM Distance: >3 FB Neck ROM: Full    Dental no notable dental hx. (+) Teeth Intact, Dental Advisory Given   Pulmonary neg pulmonary ROS,    Pulmonary exam normal breath sounds clear to auscultation       Cardiovascular Exercise Tolerance: Good negative cardio ROS Normal cardiovascular exam Rhythm:Regular Rate:Normal     Neuro/Psych negative neurological ROS     GI/Hepatic negative GI ROS, Neg liver ROS,   Endo/Other  diabetes, GestationalMorbid obesity  Renal/GU negative Renal ROS     Musculoskeletal   Abdominal (+) + obese,   Peds  Hematology negative hematology ROS (+)   Anesthesia Other Findings   Reproductive/Obstetrics (+) Pregnancy                             Lab Results  Component Value Date   WBC 14.2 (H) 12/11/2017   HGB 11.9 (L) 12/11/2017   HCT 37.0 12/11/2017   MCV 77.9 (L) 12/11/2017   PLT 210 12/11/2017    Anesthesia Physical Anesthesia Plan  ASA: III  Anesthesia Plan: Epidural   Post-op Pain Management:    Induction:   PONV Risk Score and Plan: 2 and Ondansetron and Treatment may vary due to age or medical condition  Airway Management Planned: Natural Airway  Additional Equipment: None  Intra-op Plan:   Post-operative Plan:   Informed Consent: I have reviewed the patients History and Physical, chart, labs and discussed the procedure including the risks, benefits and alternatives for the proposed anesthesia with the patient or authorized representative who has indicated his/her understanding and acceptance.     Plan Discussed with: CRNA and Anesthesiologist  Anesthesia Plan Comments: (Epidural functioning well during labor. Will utilize for  postpartum tubal ligation.)       Anesthesia Quick Evaluation

## 2017-12-11 NOTE — Op Note (Signed)
Cathy RiggsLauren Haskin 12/11/2017  PREOPERATIVE DIAGNOSIS:  Multiparity, undesired fertility  POSTOPERATIVE DIAGNOSIS:  Multiparity, undesired fertility  PROCEDURE:  Postpartum Bilateral Tubal Sterilization using Filshie Clips   ANESTHESIA:  Epidural  COMPLICATIONS:  None immediate.  ESTIMATED BLOOD LOSS:  Less than 20 ml.  FLUIDS: 500 ml LR.  URINE OUTPUT:  50 ml of clear urine.  INDICATIONS: 27 y.o. [id number]  with undesired fertility,status post vaginal delivery, desires permanent sterilization. Risks and benefits of procedure discussed with patient including permanence of method, bleeding, the use of Filshie clips, infection, injury to surrounding organs and need for additional procedures. Risk failure of 0.5-1% with increased risk of ectopic gestation if pregnancy occurs was also discussed with patient.   FINDINGS:  Normal uterus, tubes, and ovaries.  TECHNIQUE:  The patient was taken to the operating room where her epidural anesthesia was dosed up to surgical level and found to be adequate.  She was then placed in the dorsal supine position and prepped and draped in sterile fashion.  After an adequate timeout was performed, attention was turned to the patient's abdomen where a small transverse skin incision was made under the umbilical fold. The incision was taken down to the layer of fascia using the scalpel, and fascia was incised, and extended bilaterally using Mayo scissors. The peritoneum was entered in a sharp fashion. Attention was then turned to the patient's uterus, and left fallopian tube was identified and followed out to the fimbriated end.  A Filshie clip was placed on the left fallopian tube about 2 cm from the cornual attachment, with care given to incorporate the underlying mesosalpinx.  A similar process was carried out on the right side allowing for bilateral tubal sterilization.  Good hemostasis was noted overall.  Local analgesia was drizzled on both operative sites.The  instruments were then removed from the patient's abdomen and the fascial incision was repaired with 0 Vicryl, and the skin was closed with a 4-0 Vicryl subcuticular stitch. The patient tolerated the procedure well.  Sponge, lap, and needle counts were correct times two.  The patient was then taken to the recovery room awake, extubated and in stable condition.  Scheryl DarterJames Arnold MD 12/11/2017 3:01 PM

## 2017-12-11 NOTE — Anesthesia Postprocedure Evaluation (Signed)
Anesthesia Post Note  Patient: Bobbye RiggsLauren Gravley  Procedure(s) Performed: POST PARTUM TUBAL LIGATION (N/A )     Patient location during evaluation: Mother Baby Anesthesia Type: Epidural Level of consciousness: awake and alert Pain management: pain level controlled Vital Signs Assessment: post-procedure vital signs reviewed and stable Respiratory status: spontaneous breathing, nonlabored ventilation and respiratory function stable Cardiovascular status: stable Postop Assessment: no headache, no backache and epidural receding Anesthetic complications: no    Last Vitals:  Vitals:   12/11/17 1804 12/11/17 2112  BP: 118/76 123/79  Pulse: 94 90  Resp: 18 18  Temp: 36.8 C 36.6 C  SpO2: 98% 98%    Last Pain:  Vitals:   12/11/17 1804  TempSrc: Oral  PainSc: 3    Pain Goal: Patients Stated Pain Goal: 2 (12/11/17 0050)               Junious SilkGILBERT,Amariyana Heacox

## 2017-12-11 NOTE — Progress Notes (Addendum)
Cathy RiggsLauren Park is a 27 y.o. G3P2002 at 522w0d admitted for induction of labor due to A2GDM.  Subjective: Resting comfortably with epidural in place. Sleeping through contractions. Denies pain  Objective: BP (!) 91/54   Pulse 99   Temp 98.1 F (36.7 C) (Axillary)   Resp 18   Ht 5\' 2"  (1.575 m)   Wt 244 lb 7 oz (110.9 kg)   LMP 03/13/2017 (Exact Date)   SpO2 98%   BMI 44.71 kg/m  I/O last 3 completed shifts: In: -  Out: 300 [Urine:300] No intake/output data recorded.  FHT:  FHR: 140 bpm, variability: moderate,  accelerations:  Present,  decelerations:  Present Variable decel at 0830, resolved UC:   irregular, every 2-5 minutes SVE:   Dilation: 6 Effacement (%): 80 Station: -1 to -2 Exam by:: Sam,CNM  Labs: Lab Results  Component Value Date   WBC 14.2 (H) 12/11/2017   HGB 11.9 (L) 12/11/2017   HCT 37.0 12/11/2017   MCV 77.9 (L) 12/11/2017   PLT 210 12/11/2017    Assessment / Plan: -Induction of labor due to A2GDM,  progressing well on pitocin, infusing at 12 milliunits -Category 1 EFM -Epidural in place and effectively managing pain -AROM when more engaged -Anticipate SVD   Calvert CantorSamantha C Weinhold, CNM 12/11/2017, 8:58 AM

## 2017-12-11 NOTE — Progress Notes (Signed)
Labor Progress Note Cathy Park is a 27 y.o. G3P2002 at 5336w0d presented for IOL for A2GDM. S: Feeling a lot of pressure/pain, s/p FB, much more comfortable s/p epidural placement.   O:  BP 104/60   Pulse 95   Temp 98.1 F (36.7 C) (Oral)   Resp 18   Ht 5\' 2"  (1.575 m)   Wt 110.9 kg (244 lb 7 oz)   LMP 03/13/2017 (Exact Date)   SpO2 98%   BMI 44.71 kg/m  EFM: 140 bpm/moderate variability/+accel, no decel  CVE: Dilation: 4.5 Effacement (%): 70 Cervical Position: Posterior Station: -3 Presentation: Vertex Exam by:: Cathy Murdochourtney Hernandez, RN   A&P: 27 y.o. [id number] 7736w0d here for IOL for A2GDM. #Labor: s/p FB. SVE now 4.5/70/-3. Pitocin just started after epidural placement. Plan for AROM when appropriate.  #A2GDM: CBGs q4h; CBG 109 on arrival.  #Pain: s/p epidural #FWB: Cat 1 FHT. #GBS negative   Felicita GageBrittany P Donavyn Fecher, MD 5:37 AM

## 2017-12-11 NOTE — Progress Notes (Signed)
27 y.o. [id number] with undesired fertility,status post vaginal delivery, desires permanent sterilization. Risks and benefits of procedure discussed with patient including permanence of method, bleeding, infection, injury to surrounding organs and need for additional procedures. Risk failure of 0.5-1% with increased risk of ectopic gestation if pregnancy occurs was also discussed with patient. Patient verbalized understanding and all questions were answered.  Currie ParisJames G. Debroah LoopArnold MD 12/11/2017 2:08 PM   Patient ID: Cathy RiggsLauren Park, female   DOB: 07/18/1990, 27 y.o.   MRN: [id number]

## 2017-12-11 NOTE — Anesthesia Procedure Notes (Signed)
Epidural Patient location during procedure: OB Start time: 12/11/2017 4:23 AM End time: 12/11/2017 4:40 AM  Staffing Anesthesiologist: Trevor IhaHouser, Stephen A, MD Performed: anesthesiologist   Preanesthetic Checklist Completed: patient identified, site marked, surgical consent, pre-op evaluation, timeout performed, IV checked, risks and benefits discussed and monitors and equipment checked  Epidural Patient position: sitting Prep: site prepped and draped and DuraPrep Patient monitoring: continuous pulse ox and blood pressure Approach: midline Location: L3-L4 Injection technique: LOR air  Needle:  Needle type: Tuohy  Needle gauge: 17 G Needle length: 9 cm and 9 Needle insertion depth: 7 cm Catheter type: closed end flexible Catheter size: 19 Gauge Catheter at skin depth: 13 cm Test dose: negative  Assessment Events: blood not aspirated, injection not painful, no injection resistance, negative IV test and no paresthesia

## 2017-12-11 NOTE — Progress Notes (Signed)
Bobbye RiggsLauren Allsbrook is a 27 y.o. G3P2002 at 10144w0d  admitted for induction of labor due to A2GDM.  Subjective: Sleeping during contractions, very comfortable with epidural  Objective: BP (!) 97/53   Pulse 82   Temp 98.4 F (36.9 C) (Oral)   Resp 16   Ht 5\' 2"  (1.575 m)   Wt 244 lb 7 oz (110.9 kg)   LMP 03/13/2017 (Exact Date)   SpO2 98%   BMI 44.71 kg/m  I/O last 3 completed shifts: In: -  Out: 300 [Urine:300] No intake/output data recorded.  FHT:  FHR: 140 bpm, variability: moderate,  accelerations:  Present,  decelerations:  Absent and   UC:   regular, every 2-4 minutes SVE:   Dilation: 7 Effacement (%): 80 Station: 0 Exam by:: Sam CNM  Labs: Lab Results  Component Value Date   WBC 14.2 (H) 12/11/2017   HGB 11.9 (L) 12/11/2017   HCT 37.0 12/11/2017   MCV 77.9 (L) 12/11/2017   PLT 210 12/11/2017    Assessment / Plan: Induction of labor due to A2GDM,  progressing well on pitocin  Labor: AROM'd now Preeclampsia:  N/A Fetal Wellbeing:  Category I Pain Control:  Epidural I/D:  n/a Anticipated MOD:  NSVD  Calvert CantorSamantha C Weinhold, CNM 12/11/2017, 11:21 AM

## 2017-12-12 LAB — RPR: RPR Ser Ql: NONREACTIVE

## 2017-12-12 LAB — GLUCOSE, CAPILLARY: Glucose-Capillary: 111 mg/dL — ABNORMAL HIGH (ref 65–99)

## 2017-12-12 NOTE — Progress Notes (Signed)
Post Partum Day 1 Subjective: up ad lib, voiding and tolerating PO. Some complaints about her aches and pains in shoulder, back, and stomach; controlled with medication.  Objective: Blood pressure 113/75, pulse 84, temperature 98.2 F (36.8 C), temperature source Oral, resp. rate 18, height 5\' 2"  (1.575 m), weight 110.9 kg (244 lb 7 oz), last menstrual period 03/13/2017, SpO2 100 %, unknown if currently breastfeeding.  Physical Exam:  General: alert, cooperative and no distress Lochia: appropriate Uterine Fundus: firm Incision: no significant drainage DVT Evaluation: No evidence of DVT seen on physical exam. No cords or calf tenderness. Calf/Ankle edema is present.  Recent Labs    12/11/17 0117  HGB 11.9*  HCT 37.0    Assessment/Plan: Plan for discharge tomorrow  AM CBG 111 S/p BTL   LOS: 1 day   Caryl AdaJazma Phelps, DO 12/12/2017, 7:44 AM

## 2017-12-12 NOTE — Lactation Note (Signed)
This note was copied from a baby's chart. Lactation Consultation Note Baby 17 hrs old. Mom is breast/formula.  Mom stated she BF her now 25 yr old for 6 months and her now 40 1/25 yr old for 3 months. Mom states that she mainly wants this baby to get colostrum. Mom pumped and bottle fed her other 2 children and will probably do that with this baby as well. Mom stated she probably will not BF very long d/t little ones at home. Mom stated she may pump when she gets home she's not sure. LC encouraged to give colostrum by hand expression. Reminded mom she may not collect anything when first starts pumping but cont. For stimulation. Encouraged mom to BF or give colostrum before breast. Mom had BTL yesterday, having a lot of cramping, BF causes cramping to be worse. Explained reasoning.  Encouraged STS, I&O, supply and demand. Encouraged to call for questions or assistance.    WH/LC brochure given w/resources, support groups and LC services.  Patient Name: Cathy Park WGNFA'OToday's Date: 12/12/2017 Reason for consult: Initial assessment;Maternal endocrine disorder Type of Endocrine Disorder?: Diabetes   Maternal Data Has patient been taught Hand Expression?: Yes Does the patient have breastfeeding experience prior to this delivery?: Yes  Feeding Feeding Type: Formula Nipple Type: Slow - flow  LATCH Score       Type of Nipple: Everted at rest and after stimulation  Comfort (Breast/Nipple): Soft / non-tender        Interventions Interventions:  Breast feeding basics reviewed;Support pillows;Skin to skin;Breast massage;Hand express;Breast compression  Lactation Tools Discussed/Used     Consult Status Consult Status: Follow-up Date: 12/12/17 Follow-up type: In-patient    [name], [name] 12/12/2017, 6:20 AM

## 2017-12-12 NOTE — Consult Note (Signed)
Patient requested to see anesthesiologist regarding back pain s/p vaginal delivery with epidural for labor 6/14, followed by PPTL under epidural. Patient states she did not experience back pain until after the epidural wore off following the PPTL. Pain described as sore/aching, with intermittent "electric-like" sensations in back (no radiation to hips or legs). Pain is mild-moderate in intensity, denies any sensory deficits or motor weakness, able to stand and walk easily. Pain relieved with ibuprofen and oxycodone. Patient has some tenderness in midline at epidural insertion site and more caudally. Bruising noted at epidural insertion point. Per report from MD who placed epidural yesterday, some difficulty occurred with placement related to patient's body habitus. Most likely, back pain resulting from combination of labor-related pain and pain from epidural insertion. Little concern for epidural/spinal hematoma given lack of sensory deficits or motor weakness. Instructed patient to alert medical team ASAP if she develops sensory or motor deficits. Otherwise, recommended to continue current therapy for pain management.

## 2017-12-13 ENCOUNTER — Other Ambulatory Visit: Payer: Self-pay

## 2017-12-13 MED ORDER — ALBUTEROL SULFATE HFA 108 (90 BASE) MCG/ACT IN AERS: 1.0000 | INHALATION_SPRAY | Freq: Four times a day (QID) | RESPIRATORY_TRACT | 0 refills | Status: AC | PRN

## 2017-12-13 MED ORDER — SENNA-DOCUSATE SODIUM 8.6-50 MG PO TABS: 1.0000 | ORAL_TABLET | Freq: Every day | ORAL | 0 refills | Status: AC | PRN

## 2017-12-13 MED ORDER — FERROUS SULFATE 325 (65 FE) MG PO TABS: 325.0000 mg | ORAL_TABLET | Freq: Two times a day (BID) | ORAL | 0 refills | Status: AC

## 2017-12-13 MED ORDER — IBUPROFEN 600 MG PO TABS: 600.0000 mg | ORAL_TABLET | Freq: Four times a day (QID) | ORAL | 0 refills | Status: AC

## 2017-12-13 MED ORDER — OXYCODONE-ACETAMINOPHEN 5-325 MG PO TABS: 1.0000 | ORAL_TABLET | Freq: Four times a day (QID) | ORAL | 0 refills | Status: AC | PRN

## 2017-12-13 NOTE — Discharge Instructions (Signed)

## 2017-12-13 NOTE — Discharge Summary (Signed)
OB Discharge Summary     Patient Name: Cathy Park DOB: 11-09-1990 MRN: [id number]  Date of admission: 12/11/2017 Delivering MD: Mallie Snooks C   Date of discharge: 12/13/2017  Admitting diagnosis: INDUCTION Intrauterine pregnancy: [redacted]w[redacted]d    Secondary diagnosis:  Active Problems:   GDM (gestational diabetes mellitus)  Additional problems:  - A2GDM, on metformin and glyburide - obesity     Discharge diagnosis: Term Pregnancy Delivered and GDM A2                                                                                                Post partum procedures:postpartum tubal ligation  Augmentation: AROM, Pitocin and Foley Balloon  Complications: None  Hospital course:  Induction of Labor With Vaginal Delivery   27y.o. yo G3P3003 at 25w0das admitted to the hospital 12/11/2017 for induction of labor.  Indication for induction: A2 DM.  Patient had an uncomplicated labor course as follows: Membrane Rupture Time/Date: 11:14 AM ,12/11/2017   Intrapartum Procedures: Episiotomy: None [1]                                         Lacerations:  1st degree [2]  Patient had delivery of a Viable infant.  Information for the patient's newborn:  JaCarleen, Rhue0[[id number]]Delivery Method: Vaginal, Spontaneous(Filed from Delivery Summary)   12/11/2017  Details of delivery can be found in separate delivery note.  Patient had a routine postpartum course. Patient is discharged home 12/13/17.  Physical exam  Vitals:   12/12/17 1950 12/12/17 2257 12/12/17 2300 12/13/17 0542  BP: 134/88 118/82  119/75  Pulse: 96 95  83  Resp: '18 18  18  '$ Temp: 97.8 F (36.6 C) 98.3 F (36.8 C)  97.6 F (36.4 C)  TempSrc: Oral Oral  Axillary  SpO2: 100% 95% 100% 100%  Weight:      Height:       General: alert, cooperative and no distress Lochia: appropriate Uterine Fundus: firm Incision: Dressing is clean, dry, and intact DVT Evaluation: No evidence of DVT seen on physical exam. No  cords or calf tenderness. Calf/Ankle edema is present Labs: Lab Results  Component Value Date   WBC 14.2 (H) 12/11/2017   HGB 11.9 (L) 12/11/2017   HCT 37.0 12/11/2017   MCV 77.9 (L) 12/11/2017   PLT 210 12/11/2017   CMP Latest Ref Rng & Units 08/14/2017  Glucose 65 - 99 mg/dL 116(H)  BUN 6 - 20 mg/dL 8  Creatinine 0.44 - 1.00 mg/dL 0.60  Sodium 135 - 145 mmol/L 137  Potassium 3.5 - 5.1 mmol/L 3.5  Chloride 101 - 111 mmol/L 102  CO2 22 - 32 mmol/L 22  Calcium 8.9 - 10.3 mg/dL 8.6(L)    Discharge instruction: per After Visit Summary and "Baby and Me Booklet".  After visit meds:  Allergies as of 12/13/2017   No Known Allergies     Medication List    STOP taking these medications   ACCU-CHEK FASTCLIX  LANCETS Misc   ACCU-CHEK GUIDE w/Device Kit   glucose blood test strip Commonly known as:  ACCU-CHEK GUIDE   glyBURIDE 2.5 MG tablet Commonly known as:  DIABETA   metFORMIN 500 MG tablet Commonly known as:  GLUCOPHAGE   ondansetron 8 MG disintegrating tablet Commonly known as:  ZOFRAN-ODT     TAKE these medications   acetaminophen 325 MG tablet Commonly known as:  TYLENOL Take 650 mg by mouth every 6 (six) hours as needed for moderate pain or headache.   ferrous sulfate 325 (65 FE) MG tablet Take 1 tablet (325 mg total) by mouth 2 (two) times daily with a meal.   ibuprofen 600 MG tablet Commonly known as:  ADVIL,MOTRIN Take 1 tablet (600 mg total) by mouth every 6 (six) hours.   oxyCODONE-acetaminophen 5-325 MG tablet Commonly known as:  PERCOCET/ROXICET Take 1 tablet by mouth every 6 (six) hours as needed.   prenatal multivitamin Tabs tablet Take 1 tablet by mouth daily at 12 noon.   sennosides-docusate sodium 8.6-50 MG tablet Commonly known as:  SENOKOT-S Take 1 tablet by mouth daily as needed for constipation.       Diet: routine diet  Activity: Advance as tolerated. Pelvic rest for 6 weeks.   Outpatient follow up:6 weeks Follow up Appt:No  future appointments. Follow up Visit: Index for [place] [facility] Follow up.   Specialty:  Obstetrics and Gynecology Why:  For postpartum care Contact information: [place] [phone]           Postpartum contraception: Tubal Ligation  Newborn Data: Live born female  Birth Weight: 7 lb 14.6 oz (3590 g) APGAR: 73, 9  Newborn Delivery   Birth date/time:  12/11/2017 12:57:00 Delivery type:  Vaginal, Spontaneous     Baby Feeding: Breast Disposition:home with mother   12/13/2017 Luiz Blare, DO

## 2017-12-14 ENCOUNTER — Encounter (HOSPITAL_COMMUNITY): Payer: Self-pay | Admitting: Obstetrics & Gynecology

## 2017-12-14 LAB — GLUCOSE, CAPILLARY: GLUCOSE-CAPILLARY: 75 mg/dL (ref 65–99)

## 2017-12-15 ENCOUNTER — Encounter (HOSPITAL_COMMUNITY): Payer: Self-pay

## 2017-12-17 ENCOUNTER — Encounter: Payer: Medicaid Other | Admitting: Obstetrics & Gynecology

## 2017-12-17 ENCOUNTER — Other Ambulatory Visit: Payer: Medicaid Other

## 2017-12-21 ENCOUNTER — Encounter (HOSPITAL_COMMUNITY): Payer: Self-pay | Admitting: Anesthesiology

## 2018-01-13 NOTE — Progress Notes (Signed)
Post Partum Exam  Bobbye RiggsLauren Porath is a 27 y.o. 153P3003 female who presents for a postpartum visit. She is four weeks postpartum following a vaginal delivery: I have fully reviewed the prenatal and intrapartum course; patient had A2GDM  The delivery was at [redacted] week gestational weeks.  Anesthesia: Epidural. Postpartum course has been uncomplicated. Baby's course has been uncomplicated. Baby is feeding by bottle. Bleeding small amount. Bowel function is normal. Bladder function is normal. Patient is not sexually active. Contraception method is tubal ligation. Postpartum depression screening:negative   The following portions of the patient's history were reviewed and updated as appropriate: allergies, current medications, past family history, past medical history, past social history, past surgical history and problem list. Last pap smear done 11/15/20178 and was normal  Review of Systems Pertinent items noted in HPI and remainder of comprehensive ROS otherwise negative.    Objective:  Blood pressure 115/79, pulse 71, weight 227 lb (103 kg), unknown if currently breastfeeding.  General:  alert and no distress   Breasts:  deferred  Lungs: clear to auscultation bilaterally  Heart:  regular rate and rhythm  Abdomen: soft, non-tender; bowel sounds normal; no masses,  no organomegaly. PPBTL incision well-healed, C/D/I   Pelvic:  not evaluated        Assessment:   Normal postpartum exam. Pap smear not done at today's visit.  Had A2GDM.  Plan:   1. Contraception: tubal ligation 2. Follow up in: 3 weeks for postpartum 2 hr GTT or as needed.     Jaynie CollinsUGONNA  Sarai January, MD, FACOG Obstetrician & Gynecologist, [facility], [facility]

## 2018-01-14 ENCOUNTER — Ambulatory Visit (INDEPENDENT_AMBULATORY_CARE_PROVIDER_SITE_OTHER): Payer: Medicaid Other | Admitting: Obstetrics & Gynecology

## 2018-01-14 ENCOUNTER — Encounter: Payer: Self-pay | Admitting: Obstetrics & Gynecology

## 2018-01-14 DIAGNOSIS — Z1389 Encounter for screening for other disorder: Secondary | ICD-10-CM

## 2018-01-14 DIAGNOSIS — Z8632 Personal history of gestational diabetes: Secondary | ICD-10-CM

## 2018-01-14 NOTE — Patient Instructions (Signed)
Return to clinic for any scheduled appointments or for any gynecologic concerns as needed.   

## 2018-02-01 ENCOUNTER — Other Ambulatory Visit: Payer: Medicaid Other

## 2018-02-01 DIAGNOSIS — Z8632 Personal history of gestational diabetes: Secondary | ICD-10-CM | POA: Diagnosis not present

## 2018-02-02 LAB — GLUCOSE TOLERANCE, 2 HOURS
GLUCOSE FASTING GTT: 90 mg/dL (ref 65–99)
GLUCOSE, 2 HOUR: 88 mg/dL (ref 65–139)

## 2018-02-03 ENCOUNTER — Encounter: Payer: Self-pay | Admitting: Obstetrics & Gynecology

## 2018-09-21 ENCOUNTER — Ambulatory Visit: Payer: Medicaid Other | Admitting: Obstetrics & Gynecology

## 2019-12-25 IMAGING — US US FETAL BPP W/ NON-STRESS
1 series · 13 of 15 positions shown · non-contrast
Comparison: none

[Series 1: us fetal bpp w/nonstress · 15 acquisitions, 13 frames shown]
[im 1/15]
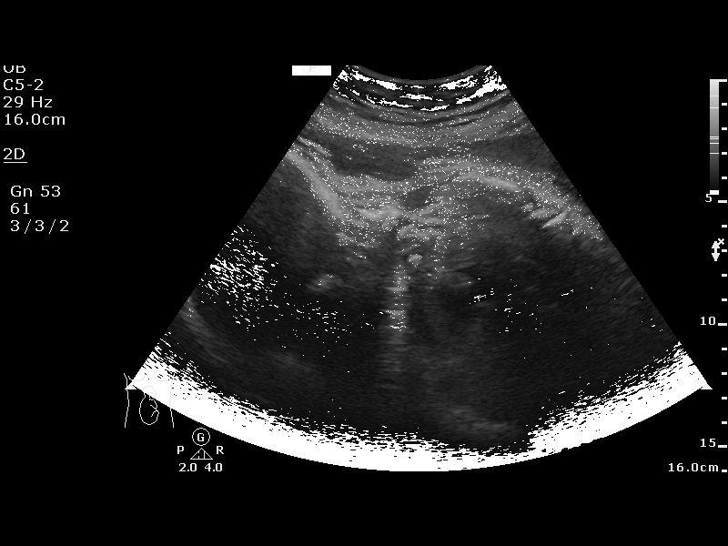
[im 2/15]
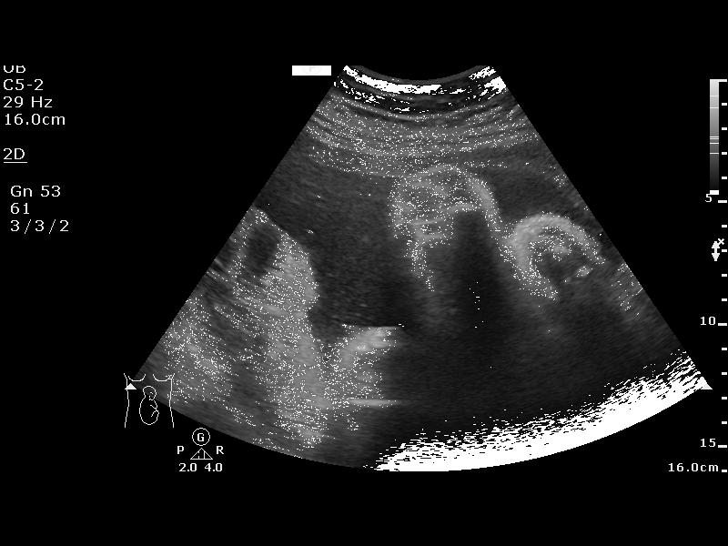
[im 3/15]
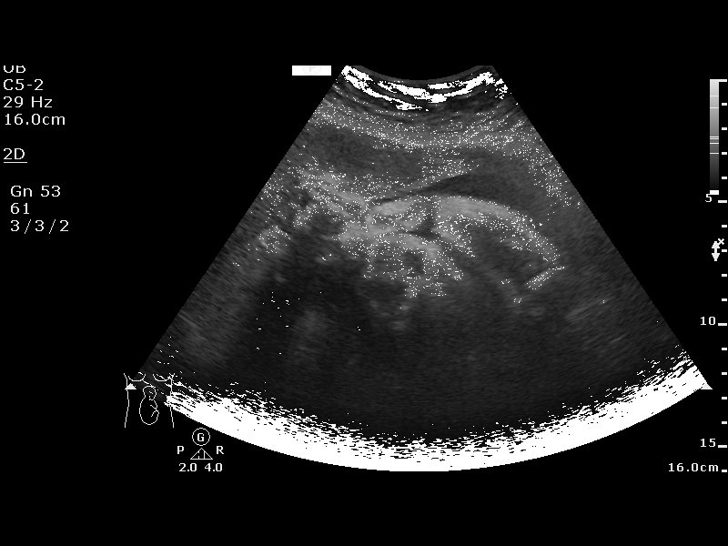
[im 5/15]
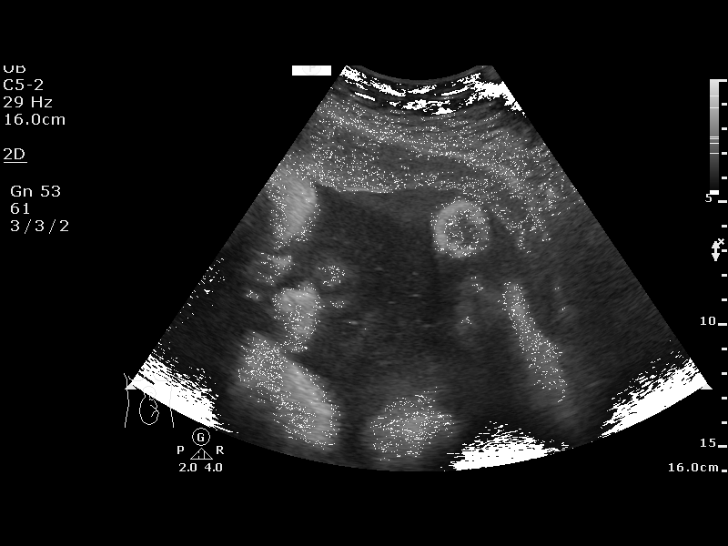
[im 6/15]
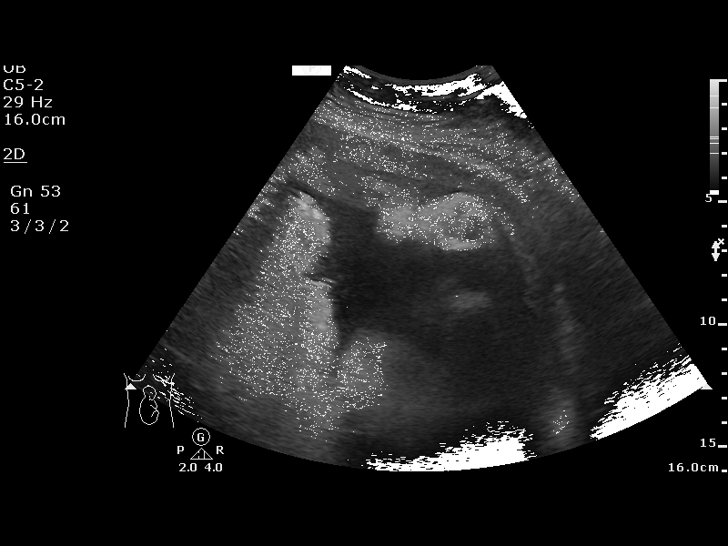
[im 7/15]
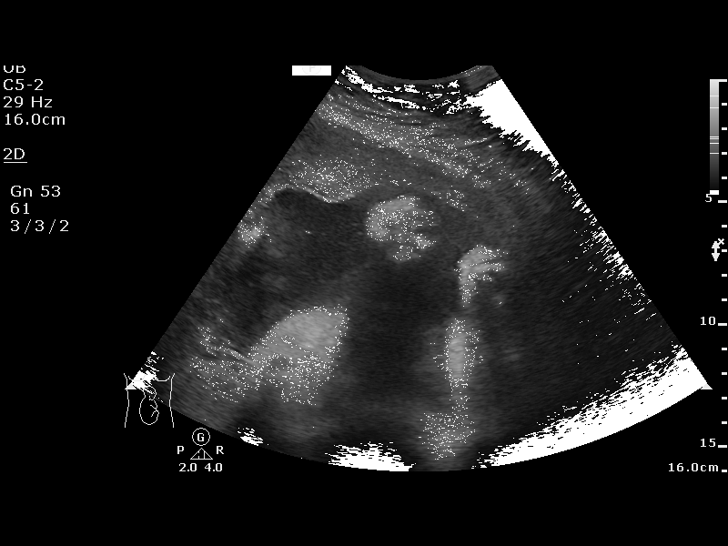
[im 8/15]
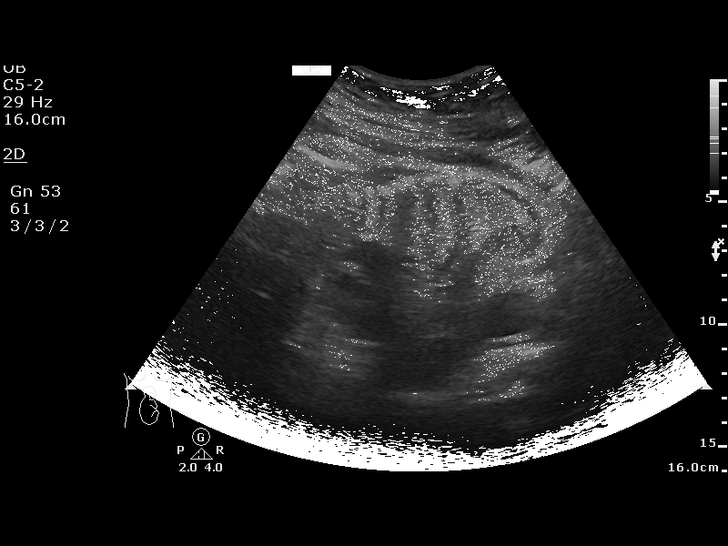
[im 9/15]
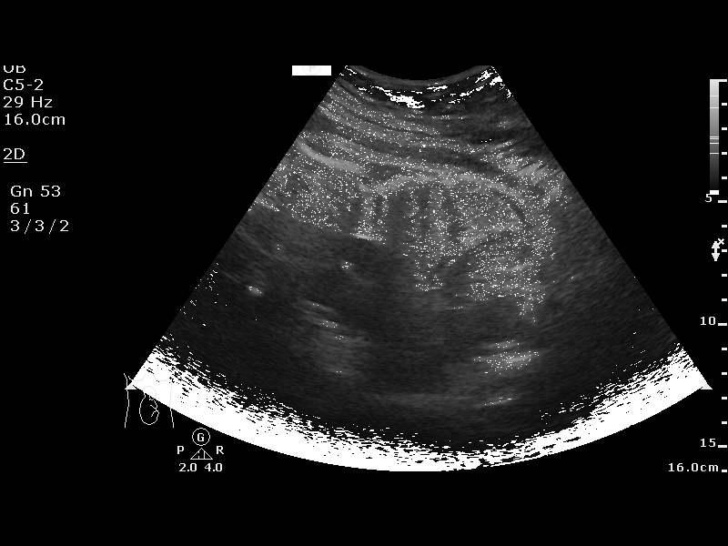
[im 10/15]
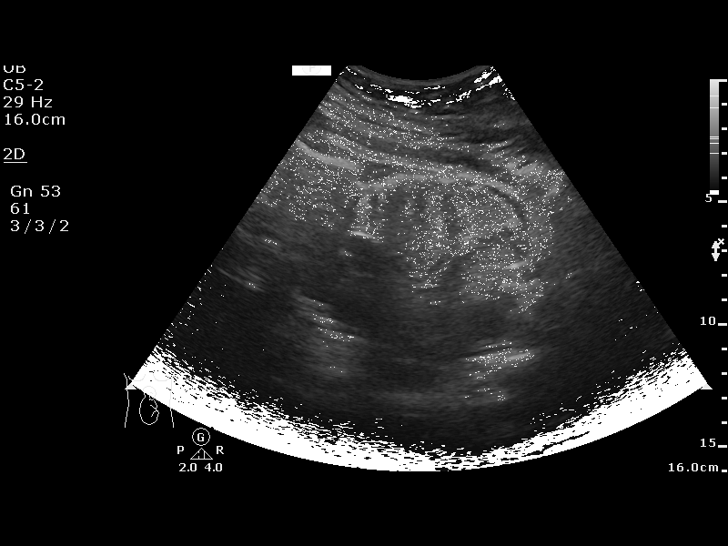
[im 11/15]
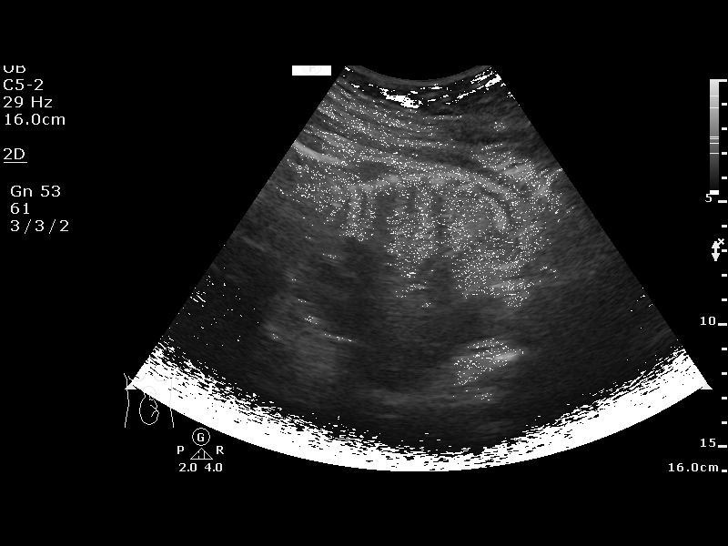
[im 13/15]
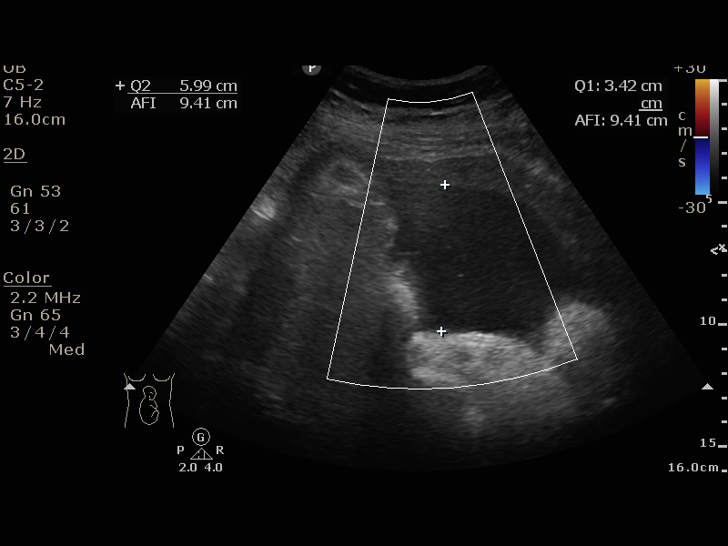
[im 14/15]
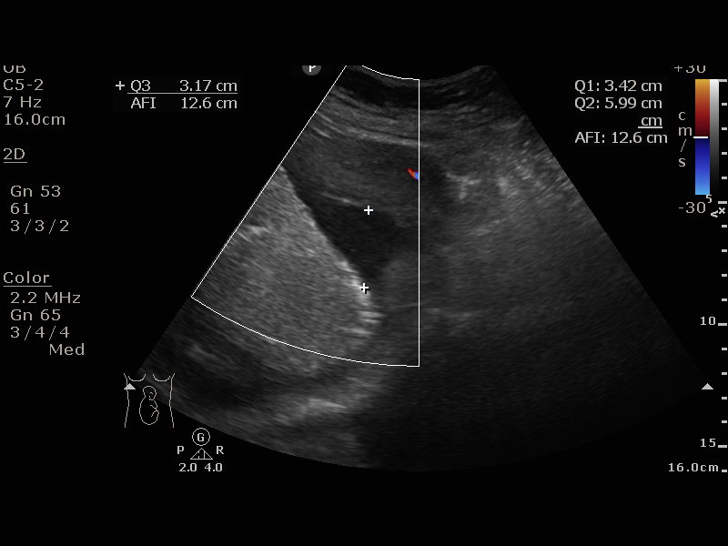
[im 15/15]
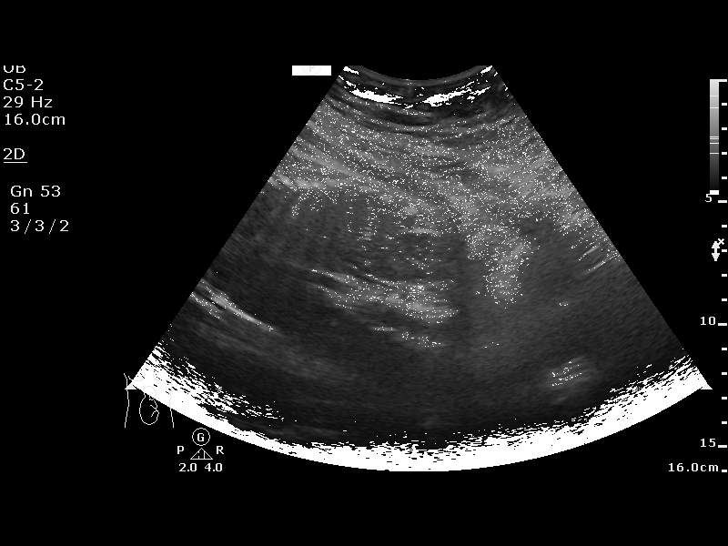

[13 of 15 positions shown; findings below may reference images not displayed]

OB/Gyn Clinic
Women's
[REDACTED]

1  US FETAL BPP W/NONSTRESS                    76818.4

1  JOSHP SCARMAGNANI          [id number]      [id number]     [id number]
Service(s) Provided

Indications

33 weeks gestation of pregnancy
Gestational diabetes in pregnancy,
controlled by oral hypoglycemic drugs
OB History

Gravidity:    3         Term:   2
Living:       2
Fetal Evaluation

Num Of Fetuses:     1
Preg. Location:     Intrauterine
Cardiac Activity:   Observed
Presentation:       Frank breech

Amniotic Fluid
AFI FV:      Subjectively within normal limits

AFI Sum(cm)     %Tile       Largest Pocket(cm)
12.58           38
RUQ(cm)                     LUQ(cm)        LLQ(cm)
3.42
Biophysical Evaluation

Amniotic F.V:   Pocket => 2 cm two         F. Tone:        Observed
planes
F. Movement:    Observed                   N.S.T:          Reactive
F. Breathing:   Observed                   Score:          [DATE]
Gestational Age

LMP:           33w 6d        Date:  03/13/17                 EDD:   12/18/17
Best:          33w 6d     Det. By:  LMP  (03/13/17)          EDD:   12/18/17
Impression

Reassuring antenatal testing
Recommendations

Continue with weekly testing.
